# Patient Record
Sex: Female | Born: 1955 | Race: White | Hispanic: No | Smoking: Never smoker
Health system: Southern US, Community
[De-identification: ages and names within clinical notes are randomized; demographics above are authoritative.]

## PROBLEM LIST (undated history)

## (undated) DIAGNOSIS — K589 Irritable bowel syndrome without diarrhea: Secondary | ICD-10-CM

## (undated) DIAGNOSIS — G43909 Migraine, unspecified, not intractable, without status migrainosus: Secondary | ICD-10-CM

## (undated) DIAGNOSIS — G5600 Carpal tunnel syndrome, unspecified upper limb: Secondary | ICD-10-CM

## (undated) DIAGNOSIS — H409 Unspecified glaucoma: Secondary | ICD-10-CM

## (undated) DIAGNOSIS — F419 Anxiety disorder, unspecified: Secondary | ICD-10-CM

## (undated) DIAGNOSIS — H8109 Meniere's disease, unspecified ear: Secondary | ICD-10-CM

## (undated) HISTORY — DX: Migraine, unspecified, not intractable, without status migrainosus: G43.909

## (undated) HISTORY — PX: PELVIC LAPAROSCOPY: SHX162

## (undated) HISTORY — DX: Anxiety disorder, unspecified: F41.9

## (undated) HISTORY — PX: CARPAL TUNNEL RELEASE: SHX101

## (undated) HISTORY — PX: OTHER SURGICAL HISTORY: SHX169

## (undated) HISTORY — DX: Unspecified glaucoma: H40.9

## (undated) HISTORY — DX: Meniere's disease, unspecified ear: H81.09

## (undated) HISTORY — DX: Irritable bowel syndrome, unspecified: K58.9

## (undated) HISTORY — DX: Carpal tunnel syndrome, unspecified upper limb: G56.00

---

## 1998-06-28 ENCOUNTER — Ambulatory Visit (HOSPITAL_COMMUNITY): Admission: RE | Admit: 1998-06-28 | Discharge: 1998-06-28 | Payer: Self-pay | Admitting: Gynecology

## 1999-12-21 ENCOUNTER — Encounter: Payer: Self-pay | Admitting: Gastroenterology

## 1999-12-21 ENCOUNTER — Ambulatory Visit (HOSPITAL_COMMUNITY): Admission: RE | Admit: 1999-12-21 | Discharge: 1999-12-21 | Payer: Self-pay | Admitting: Gastroenterology

## 2000-01-04 ENCOUNTER — Ambulatory Visit (HOSPITAL_COMMUNITY): Admission: RE | Admit: 2000-01-04 | Discharge: 2000-01-04 | Payer: Self-pay | Admitting: Gastroenterology

## 2000-12-31 ENCOUNTER — Encounter: Admission: RE | Admit: 2000-12-31 | Discharge: 2000-12-31 | Payer: Self-pay | Admitting: Family Medicine

## 2000-12-31 ENCOUNTER — Encounter: Payer: Self-pay | Admitting: Family Medicine

## 2001-01-31 ENCOUNTER — Encounter (INDEPENDENT_AMBULATORY_CARE_PROVIDER_SITE_OTHER): Payer: Self-pay

## 2001-01-31 ENCOUNTER — Other Ambulatory Visit: Admission: RE | Admit: 2001-01-31 | Discharge: 2001-01-31 | Payer: Self-pay | Admitting: Gynecology

## 2001-05-15 ENCOUNTER — Other Ambulatory Visit: Admission: RE | Admit: 2001-05-15 | Discharge: 2001-05-15 | Payer: Self-pay | Admitting: Internal Medicine

## 2002-07-09 HISTORY — PX: ABDOMINAL HYSTERECTOMY: SHX81

## 2002-08-03 ENCOUNTER — Encounter (INDEPENDENT_AMBULATORY_CARE_PROVIDER_SITE_OTHER): Payer: Self-pay | Admitting: Specialist

## 2002-08-03 ENCOUNTER — Observation Stay (HOSPITAL_COMMUNITY): Admission: RE | Admit: 2002-08-03 | Discharge: 2002-08-04 | Payer: Self-pay | Admitting: Gynecology

## 2003-09-15 ENCOUNTER — Other Ambulatory Visit: Admission: RE | Admit: 2003-09-15 | Discharge: 2003-09-15 | Payer: Self-pay | Admitting: Gynecology

## 2004-01-13 ENCOUNTER — Encounter: Admission: RE | Admit: 2004-01-13 | Discharge: 2004-01-13 | Payer: Self-pay | Admitting: Gynecology

## 2004-11-17 ENCOUNTER — Other Ambulatory Visit: Admission: RE | Admit: 2004-11-17 | Discharge: 2004-11-17 | Payer: Self-pay | Admitting: Gynecology

## 2005-02-06 ENCOUNTER — Encounter: Admission: RE | Admit: 2005-02-06 | Discharge: 2005-02-06 | Payer: Self-pay | Admitting: Gynecology

## 2005-05-31 ENCOUNTER — Ambulatory Visit (HOSPITAL_BASED_OUTPATIENT_CLINIC_OR_DEPARTMENT_OTHER): Admission: RE | Admit: 2005-05-31 | Discharge: 2005-05-31 | Payer: Self-pay | Admitting: Orthopedic Surgery

## 2006-02-07 ENCOUNTER — Encounter: Admission: RE | Admit: 2006-02-07 | Discharge: 2006-02-07 | Payer: Self-pay | Admitting: Gynecology

## 2006-02-13 ENCOUNTER — Other Ambulatory Visit: Admission: RE | Admit: 2006-02-13 | Discharge: 2006-02-13 | Payer: Self-pay | Admitting: Obstetrics and Gynecology

## 2007-05-21 ENCOUNTER — Other Ambulatory Visit: Admission: RE | Admit: 2007-05-21 | Discharge: 2007-05-21 | Payer: Self-pay | Admitting: Gynecology

## 2008-06-03 ENCOUNTER — Other Ambulatory Visit: Admission: RE | Admit: 2008-06-03 | Discharge: 2008-06-03 | Payer: Self-pay | Admitting: Gynecology

## 2008-06-03 ENCOUNTER — Encounter: Payer: Self-pay | Admitting: Gynecology

## 2008-06-03 ENCOUNTER — Ambulatory Visit: Payer: Self-pay | Admitting: Gynecology

## 2008-07-12 ENCOUNTER — Ambulatory Visit: Payer: Self-pay | Admitting: Gynecology

## 2008-09-30 ENCOUNTER — Ambulatory Visit (HOSPITAL_BASED_OUTPATIENT_CLINIC_OR_DEPARTMENT_OTHER): Admission: RE | Admit: 2008-09-30 | Discharge: 2008-09-30 | Payer: Self-pay | Admitting: Orthopedic Surgery

## 2009-06-06 ENCOUNTER — Ambulatory Visit: Payer: Self-pay | Admitting: Gynecology

## 2009-06-06 ENCOUNTER — Other Ambulatory Visit: Admission: RE | Admit: 2009-06-06 | Discharge: 2009-06-06 | Payer: Self-pay | Admitting: Gynecology

## 2009-07-25 ENCOUNTER — Encounter: Admission: RE | Admit: 2009-07-25 | Discharge: 2009-07-25 | Payer: Self-pay | Admitting: Gastroenterology

## 2010-06-07 ENCOUNTER — Other Ambulatory Visit: Payer: Self-pay | Admitting: Gynecology

## 2010-06-07 ENCOUNTER — Encounter (INDEPENDENT_AMBULATORY_CARE_PROVIDER_SITE_OTHER): Payer: BC Managed Care – PPO | Admitting: Gynecology

## 2010-06-07 ENCOUNTER — Encounter: Payer: Self-pay | Admitting: Obstetrics and Gynecology

## 2010-06-07 ENCOUNTER — Other Ambulatory Visit (HOSPITAL_COMMUNITY)
Admission: RE | Admit: 2010-06-07 | Discharge: 2010-06-07 | Disposition: A | Discharge status: Home / Self Care | Payer: BC Managed Care – PPO | Source: Ambulatory Visit | Attending: Gynecology | Admitting: Gynecology

## 2010-06-07 DIAGNOSIS — Z1211 Encounter for screening for malignant neoplasm of colon: Secondary | ICD-10-CM

## 2010-06-07 DIAGNOSIS — Z124 Encounter for screening for malignant neoplasm of cervix: Secondary | ICD-10-CM | POA: Insufficient documentation

## 2010-06-07 DIAGNOSIS — Z01419 Encounter for gynecological examination (general) (routine) without abnormal findings: Secondary | ICD-10-CM

## 2010-07-17 LAB — BASIC METABOLIC PANEL
BUN: 19 mg/dL (ref 6–23)
CO2: 28 mEq/L (ref 19–32)
Calcium: 9.1 mg/dL (ref 8.4–10.5)
Chloride: 106 mEq/L (ref 96–112)
Creatinine, Ser: 0.75 mg/dL (ref 0.4–1.2)
GFR calc Af Amer: >60 mL/min (ref >60)
GFR calc non Af Amer: >60 mL/min (ref >60)
Glucose, Bld: 77 mg/dL (ref 70–99)
Potassium: 3.5 mEq/L (ref 3.5–5.1)
Sodium: 142 mEq/L (ref 135–145)

## 2010-07-17 LAB — POCT HEMOGLOBIN-HEMACUE: Hemoglobin: 14.2 g/dL (ref 12.0–15.0)

## 2010-08-22 NOTE — Op Note (Signed)
NAME:  Ellen Yu, Ellen Yu              ACCOUNT NO.:  [id number]   MEDICAL RECORD NO.:  [id number]          PATIENT TYPE:  AMB   LOCATION:  DSC                          FACILITY:  [facility]   PHYSICIAN:  Katy Fitch. Sypher, M.D. 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   OPERATION:  1. Release of right transverse carpal ligament.  2. Decompression of the left ulnar nerve at cubital tunnel with      identification of anconeus epitrochlearis muscle that was resected      as well as a small portion of the medial triceps creating an in      situ decompression of the ulnar nerve.   OPERATING SURGEON:  Katy Fitch. Sypher, MD   ASSISTANT:  Nurse.   ANESTHESIA:  General by LMA.   SUPERVISING ANESTHESIOLOGIST:  Quita Skye. 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  She has a history of bilateral hand  numbness.   She had electrodiagnostic studies documenting bilateral carpal tunnel  syndrome and left ulnar neuropathy.  She is status post decompression of  her left carpal tunnel and left ulnar nerve in February 2007 with  satisfactory result.  She has had ongoing right side symptoms and return  for evaluation and management.   We confirmed that she had persistent right carpal tunnel syndrome with  electrodiagnostic studies completed by Dr. Sharmon Revere on August 02, 2008.  She did not have frankly abnormal ulnar nerve conductions,  but did have some slowing across the elbow.  She did, however, have  clinical symptoms of ulnar neuropathy and we had discussed possible  exploration and decompression at the elbow.  She thought this through  and in the holding area, asked me to proceed with ulnar decompression  due to her satisfaction  with prior left-sided surgery.  She did have  possible symptoms.   After informed consent, she was brought to the operating room at this  time.   PROCEDURE:  Ellen Yu was brought to the operating room and placed  in supine position on the operating table.   Following the induction of general anesthesia by LMA, the right arm was  prepped with Betadine soap solution and sterilely draped.  A pneumatic  tourniquet was applied to the proximal right brachium.   Following exsanguination of the right arm with an Esmarch bandage, the  arterial tourniquet was inflated to 230 mmHg.  Procedure commenced with  a short incision in the line of the ring finger in the palm.  Subcutaneous tissues were carefully divided revealing the palmar fascia.  This was split longitudinally to reveal a common sensory branch of the  median nerve.  These were followed back to transcarpal ligaments, which  was gently isolated from median nerve.  The ligament was then released  along its ulnar border extending into the distal forearm.  This widely  opened carpal canal.  Bleeding points along the margin of the released  ligament were electrocauterized with bipolar current followed by repair  of the skin with intradermal 3-0 Prolene suture and a Steri-Strip.  Attention was then directed to the medial right elbow.  A 2-cm incision  was fashioned directly over the path of the ulnar nerve behind the  epicondyle.  Subcutaneous tissues were carefully divided taking care to  identify and gently spare the anterior branch of the medial antebrachial  cutaneous nerve.  The ulnar nerve was identified by palpation posterior  to the epicondyle.  There was a large anconeus epitrochlearis muscle  that was covering the nerve.  This blended with the triceps fascia.  With great care, the anconeus epitrochlearis muscle was resected  exposing the ulnar nerve.  The fascia at the head of the flexor carpi  ulnaris was split.  There were  also several significant fascial bands  compressing deep to the flexor carpi ulnaris.  The fascia was released 6  cm distal to the epicondyle and a Glorious Peach as well as fine tenotomy  scissors were used to neurolysis of nerve distally 6 cm beyond the  epicondyle.  Care was taken to protect the motor branches of the  extensor carpi ulnaris.   Proximal dissection with the aid of a Sewell elevator released fascia to  level of the arcade of Struthers.  Care was taken to inspect the triceps  aponeurosis make sure there was no sign of compression with elbow  flexion, extension.   Bleeding points were electrocauterized with bipolar current.  Ellen Yu  was noted to have a rather shallow cubital groove.  It is possible that  she has a low-grade instability of the nerve.  There was no fusiform  swelling.  Therefore, we did not transpose the nerve.  However, with a  history of anconeus epitrochlearis, it is possible that she could  develop a late subluxation of the nerve with prolonged elbow flexion.   We will be vigilant for this.  Care was taken to leave a large flap of  arcuate ligament anteriorly to try to discourage any anterior  subluxation of the nerve.   The wound was inspected for bleeding points, which were  electrocauterized with bipolar current followed by repair of the skin  with subcutaneous suture of 4-0 Vicryl and intradermal 3-0 Prolene  followed by Steri-Strips.   Both wounds were infiltrated with 2% lidocaine for postop analgesia.  The wrist was immobilized with a volar plaster splint and the elbow  dressed with sterile gauze, Tegaderm, and Ace wrap.   Ellen Yu will be encouraged to begin immediate range of motion excise  of the elbow.  She will elevate her hand.  I will see her back for  followup in 7 days for dressing change and initiation of a therapy  program.  We anticipate suture removal of the elbow in 2 weeks and at  the wrist in about 10-14 days.      Katy Fitch Sypher, M.D.  Electronically Signed     RVS/MEDQ  D:  09/30/2008  T:  10/01/2008  Job:  [id number]

## 2010-08-25 NOTE — Op Note (Signed)
NAME:  Ellen Yu, Ellen Yu              ACCOUNT NO.:  [id number]   MEDICAL RECORD NO.:  [id number]          PATIENT TYPE:  AMB   LOCATION:  DSC                          FACILITY:  [facility]   PHYSICIAN:  Katy Fitch. Sypher, M.D. DATE OF BIRTH:  12/09/1955   DATE OF PROCEDURE:  05/31/2005  DATE OF DISCHARGE:                                 OPERATIVE REPORT   PREOPERATIVE DIAGNOSIS:  1.  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  2.  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   POSTOPERATIVE DIAGNOSIS:  1.  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  2.  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   OPERATION:  1.  Decompression of left ulnar nerve at cubital tunnel in situ with partial      triceps aponeurosis resection.  2.  Left transverse carpal ligament release to decompress median nerve   SURGEON:  Katy Fitch. Sypher, M.D.   ASSISTANT:  Ellen Yu, P.A.-C.   ANESTHESIA:  General by LMA.   SUPERVISING ANESTHESIOLOGIST:  Ellen Yu, M.D.   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  Dr. 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  [name] left hand was more severely involved than the right.  We  recommended proceeding with release of her left transcarpal ligament and  decompression of left ulnar nerve across the elbow at this time.   Preoperatively, Ellen Yu was seen for detailed informed consent in the  office and we spent a considerable period time with her in the preoperative  holding area while we answered a page full of questions that she had brought  in written form.  She was advised that decompression of the nerve should  lead to improved blood  supply and improved sensation and strength.  Given  the fact that she has for nerve compression syndromes, we must continue our  search for a constitutional factor causing her predicament.  To date, Dr.  Neale Yu has identified, as a practicing neurologist, through clinical  examination and EMG nerve conduction, that she has apparently four  entrapment neuropathy predicaments.   After informed consent, she is brought to the operating room at this time  anticipating simple decompression of her median and ulnar nerves of the left  upper extremity.   PROCEDURE:  Ellen Yu is brought to the operating room and placed in  supine position on the operating table.  Following the induction of general  anesthesia by LMA technique, the left arm was prepped with Betadine soap  solution and sterilely draped.  A pneumatic tourniquet was applied to the  proximal left brachium.  Following exsanguination of the left arm with an  Esmarch bandage, the arterial tourniquet on the proximal brachium was  inflated to 220 mmHg.   The procedure commenced with a short incision in the line of the ring finger  and the palm.  The subcutaneous tissues were carefully divided from the  palmar fascia. This was split longitudinally to the common sensory branch of  the median nerve.  The common sensory branches were followed back to the  level of the median nerve proper.  The median nerve was gently isolated from  the transcarpal ligament by use of a Insurance risk surveyor.  After the  transcarpal ligament had been isolated, it was split along its ulnar border  with scissors extending into the distal forearm.  This widely opened carpal  canal.  No masses or other predicaments were noted.  Bleeding points along  the margin of the released ligament were electrocauterized with bipolar  current followed by repair the skin with intradermal 3-0 Prolene suture.   Attention was then directed to the elbow.  A 2 cm incision was  fashioned  directly over the path of the ulnar nerve posterior to the medial  epicondyle.  The subcutaneous tissue was carefully divided revealing a  muscle band that appeared to be a variant of the anconeus epitrochlearis  completely covering the nerve extending from the epicondyle to the flexor  carpi ulnaris.  The ulnar nerve was identified by palpation proximal to the  epicondyle and the muscle was gently released off of the epicondyle,  followed by release of the fascia of the arcuate ligament as well as  Osborne's band and the fascia of the head of flexor carpi ulnaris was split  over a distance of 4 cm followed by use of Freer to split the muscle fibers  and scissors and Freer to release fibrous bands within the muscle.  The  brachial fascia was released 4 cm above the elbow.  It appeared that the  compression was directly deep to the atypical muscle.  The elbow was ranged  0 to 140 degrees.  There was a sizable portion of the triceps muscle  posterior and deep to the nerve which caused it to roll anteriorly.  Therefore, a band of the triceps aponeurosis and a small portion of the  triceps medial head was resected with scissors and electrocautery  dissection.  The nerve was fully unroofed and had limited roll after this  aponeurosis and small amount of muscle was resected.  In my judgment, [name] should have this satisfactory decompression of her ulnar neuropathy  symptoms after this maneuver.  Bleeding points were cauterized with bipolar  current followed by repair the skin with intradermal 3-0 Prolene and Steri-  Strips.  There were no apparent complications.  Ellen Yu tolerated surgery  and anesthesia well.  She is transferred to the recovery room with stable  vital signs.      Katy Fitch Sypher, M.D.  Electronically Signed     RVS/MEDQ  D:  05/31/2005  T:  05/31/2005  Job:  [id number]

## 2010-08-25 NOTE — Op Note (Signed)
NAME:  Ellen Yu, Ellen Yu                        ACCOUNT NO.:  [id number]   MEDICAL RECORD NO.:  [id number]                   PATIENT TYPE:  INP   LOCATION:  9145                                 FACILITY:  [facility]   PHYSICIAN:  Juan H. Lily Peer, M.D.             DATE OF BIRTH:  [birth year]   DATE OF PROCEDURE:  08/03/2002  DATE OF DISCHARGE:                                 OPERATIVE REPORT   SURGEON:  Juan H. Lily Peer, M.D.   FIRST ASSISTANT:  Daniel L. Eda Paschal, M.D.   INDICATIONS:  The patient with symptomatic menorrhagia, dysmenorrhea,  anemia, and mild stress urinary incontinence and with a cystocele.   PREOPERATIVE DIAGNOSES:  1. Symptomatic second degree cystocele.  2. Dysmenorrhea.  3. Menorrhagia.  4. Anemia.  5. Mild uterine descensus.   POSTOPERATIVE DIAGNOSES:  1. Symptomatic second degree cystocele.  2. Dysmenorrhea.  3. Menorrhagia.  4. Anemia.  5. Mild uterine descensus.   ANESTHESIA:  General endotracheal anesthesia.   FINDINGS:  1. The patient with a second degree cystourethrocele.  2. Mild uterine descensus.  3. Very mild, if any, rectocele.  4. Normal sized uterus.  5. Normal tubes and ovaries.   DESCRIPTION OF OPERATION:  After the patient was adequately counseled she  was taken to the operating room where she was placed in the high lithotomy  position after general endotracheal anesthesia was obtained.  The patient  previously had signed the consent form and we decided to proceed with  bilateral salpingo-oophorectomy at time of hysterectomy.  After legs were up  in the stirrups, vagina and perineum were prepped and draped in the usual  sterile fashion.  A Foley catheter was placed in effort to monitor urinary  output.  A short weighted bill speculum was placed in posterior vaginal  vault and the Deaver retractors were used to exposure.  Two Lahey __________  clamps were utilized to grasp the anterior and posterior cervix.  1%  lidocaine with  1:100,000 epinephrine was infiltrated into the cervical  vaginal fold.  The cervical vaginal fold region was incised with a scalpel  in the circumferential fashion and a posterior colpotomy was made and the  large weighted bill speculum was exchanged for the short weighted bill  speculum.  Both uterosacral ligaments on either side were clamped, cut, and  suture ligated with 0 Vicryl suture.  The remainders of the broad and  cardinal ligaments were clamped, cut, and suture ligated with 0 Vicryl  suture.  Attention was placed to the inferior portion of the cervical  vaginal fold.  The bladder was dissected away from the cervix and lower  uterine segment.  The peritoneal cavity was entered and a Occupational hygienist  was inserted.  The cervical pedicles on both sides were clamped, cut, and  the uterus and cervix was passed off the operative field and both pedicles  were identified carefully.  There was no bleeding.  The  tube and ovary were  grasped with the Babcock clamp and each infundibulopelvic ligament  separately was free tied with 0 Vicryl suture followed by a transfixation  stitch and each tube and ovary were passed off the operative field and sent  off for histological evaluation.  After inspecting the pedicles and  demonstrating adequate hemostasis at this point the McCall's culdoplasty was  started in the following fashion.  A 2-0 Vicryl suture was introduced into  the 6 o'clock position of the vaginal cuff and reefing the peritoneum to  incorporate the uterosacral ligament on patient's left coming across the  edge where the peritoneum was to the contralateral uterosacral ligament and  returning back and exiting the 6 o'clock position.  This was placed in a  clamp and not secured until later on in the operation.  At this point we  proceeded then with the anterior colporrhaphy in the following fashion.  Allis clamps were placed in the area of the cuff and with scissors  dissection the  vaginal mucosa was accomplished to the region of the external  urethral meatus.  Traction was maintained on the vagina along the course of  the dissection to keep the wall off the bladder separate from the vaginal  mucosa in an effort to avoid trauma to the bladder.  Sharp dissection of the  adherent fascia from the vaginal mucosa was accomplished.  The dissection  was deep enough to reach the white relatively avascular plane just beneath  the vaginal epithelium.  Blunt finger dissection with a single thickness  gauze sponge was used to provide significant traction against the fascia to  free it from the mucosa beneath the urethra and bladder.  Beginning at the  external meatus successive vertical mattress sutures with 2-0 Vicryl were  placed in the mobilized periurethral fashion.  The  Kelly clamp was utilized  to depress the floor of the urethra as the sutures were tied to avoid any  necrosis of the wall of the urethra.  The last suture at the bladder neck  was anchored to the posterior pubourethral ligament as well.  The suture  passed through the periurethral fascia and then firmly inserted through the  pubourethral ligament on the posterior aspect of the symphysis pubis and a  second suture was placed on the opposite side of the urethra were  incorporated in same anatomical structure.  Both sutures were tied drawing  the periurethral fascia beneath the urethra and elevating the posterior  urethra to a high retropubic position.  The normal urethrovesical anatomy  was restored and the excess vaginal mucosa was excised.  The mucosal margins  were approximated in the midline with a running stitch of 2-0 Vicryl suture  that included some of the underlying fascia.  At this point the remainder of  vaginal cuff was closed by applying interrupted sutures of 0 Vicryl suture  and then at the end securing the McCall's culdoplasty and tying it and bringing it close and securing the knot.  Attention  was then placed to the  posterior vagina whereby initially there was a question whether rectocele  was needed or not.  The patient had good support it appears and no major  rectocele so it was decided not to proceed with a rectocele repair which was  so minimal the defect that it could potentially cause narrowing in her  vagina.  This will be explained to her postoperatively.  The vaginal cuff  was irrigated with normal saline solution and the  vagina was packed with  Premarin cream impregnated gauze into the vagina.  The patient tolerated  procedure well.  Blood loss was less than 75 mL.  Urine output was 225 mL.  She received 1600 mL of lactated Ringer's.  She had pneumatic compression  stockings to avoid deep venous thrombosis and received 1 g of Cefotan  prophylactically.  She was awakened and transferred to recovery room with  stable vital signs.                                               Juan H. Lily Peer, M.D.    JHF/MEDQ  D:  08/03/2002  T:  08/04/2002  Job:  [id number]

## 2010-08-25 NOTE — H&P (Signed)
NAME:  Ellen Yu, Ellen Yu                        ACCOUNT NO.:  [id number]   MEDICAL RECORD NO.:  [id number]                   PATIENT TYPE:  INP   LOCATION:  NA                                   FACILITY:  [facility]   PHYSICIAN:  Juan H. Lily Peer, M.D.             DATE OF BIRTH:  [birth year]   DATE OF ADMISSION:  08/03/2002  DATE OF DISCHARGE:                                HISTORY & PHYSICAL   CHIEF COMPLAINT:  1. Menometrorrhagia.  2. Anemia.   HISTORY:  The patient is a 55 year old gravida 4 para 2 who has been seen in  the office in 2003 on several occasions due to her menometrorrhagia.  She  had also seen another practitioner in the community several years ago  whereby he had done hysteroscopy and D&C for suspected endometrial polyp and  none was seen.  The patient has been on oral contraceptives pill in effort  to regulate her cycle and still complains of menorrhagia and cramping and  she also has an anatomical defect consisting of a second degree cystocele  and first degree rectocele and she suffers from mild stress urinary  incontinence.  She had been given options before to proceed with an  endometrial ablation and concurrent tubal sterilization.  However, due to  religious conviction she did not want to go that route.  She did have an  endometrial biopsy in December 2003 which demonstrated a degenerating  secretory endometrium with no hyperplasia.  She has had an [facility] and prolactin  which were normal in December 2003 and her last hemoglobin was 13.8.  She  has decided to proceed with a total vaginal hysterectomy, anterior and  posterior repair, and Kelly plication.   ALLERGIES:  She denies any medical allergies.   PAST MEDICAL HISTORY:  Irritable bowel syndrome for which she is being  followed by Dr. Loreta Ave.   PREVIOUS SURGERY:  Surgery on her left ear and mastoid surgery four times.  She has had hysteroscopy and D&C.  She has had two D&Cs with spontaneous ABs  prior to that.   She has had two normal spontaneous vaginal deliveries as  well.   MEDICATIONS:  She is currently on Nu-Iron one tablet daily.  She takes  multivitamin.  She takes Pepcid, Aciphex, and Lactaid, and Nasonex.  She had  been on the Alesse in effort to regulate her cycles.   FAMILY HISTORY:  Significant for her grandfather and cousin with diabetes.  Parents and older sibling with hypertension.  Parents with cardiovascular  disease.   PHYSICAL EXAMINATION:  VITAL SIGNS:  The patient weighs 170 pounds, 5 feet 9  inches tall.  Blood pressure 130/80.  HEENT:  Unremarkable.  NECK:  Supple, trachea midline.  No carotid bruits or thyromegaly.  LUNGS:  Clear to auscultation without rhonchi or wheezes.  HEART:  Regular rate and rhythm, no murmur or gallop.  BREAST:  Exam done at her  annual exam in February 2003 which was normal.  ABDOMEN:  Soft, nontender, without rebound or guarding.  PELVIC:  Bartholin, urethra, Skene glands within normal limits.  Vagina with  a secondary cystocele and first to second degree rectocele.  Uterus  anteverted and at the upper limits of normal.  Adnexa nonpalpable without  mass or tenderness.  RECTAL:  Unremarkable.  Hemoccult negative.   ASSESSMENT:  A 55 year old gravida 4 para 2 abortus 2 with symptomatic  menometrorrhagia unresponsive to medical therapy, and symptomatic stress  urinary incontinence due to anatomical defect with secondary cystocele and  also has a first degree rectocele and some mild descensus.  The patient is  scheduled to undergo a transvaginal hysterectomy, anterior and posterior  colporrhaphy with Kelly plication on Monday, April 26.   The risks, benefits, pros and cons of procedure discussed with the patient  as follows:  1. The risk for infection, for which she will receive prophylaxis     antibiotics.  2. The risk for hemorrhage.  In the event that she would need a blood     transfusion there is a risk of anaphylactic reaction,  hepatitis, and     AIDS.  3. The risk for deep venous thrombosis and particularly pulmonary embolism.  4. Trauma to nearby organs such as the bladder, intestines, or in the event     that the procedure is not able to be complete transvaginally an open     abdominal approach may need to be done in an effort to complete the     operation.   The patient will receive prophylactic antibiotics, and pneumatic compression  stockings in an effort to prevent deep vein thrombosis.  Also, the patient  is fully aware that sometimes after vaginal colporrhaphies that there could  be some form of vaginal stricture or discomfort during intercourse that may  require time to heal and the tissues to stretch before she has relief from  discomfort during intercourse.  All of these issues were discussed with the  patient in detail.  She is fully aware that if she engages in any strenuous  activity after the procedure without significant time to recover that this  may break down; she may need additional repair such as a sling procedure if  she were to continue to have stress incontinence at that point, and that she  would need to be referred to a urologist at that point.  All of these issues  were discussed with the patient.  All questions were answered and will  follow accordingly.   PLAN:  The patient is scheduled for Monday, August 03, 2002 at 7:30 a.m. at  [facility] for transvaginal hysterectomy,  anterior/posterior colporrhaphy, and Kelly plication.                                               Juan H. Lily Peer, M.D.    JHF/MEDQ  D:  07/31/2002  T:  07/31/2002  Job:  [id number]

## 2010-08-25 NOTE — Procedures (Signed)
[place]. [facility]  Patient:    Ellen Yu, Ellen Yu                       MRN: [id number] Proc. Date: 01/04/00 Adm. Date:  [id number] Attending:  Charna Elizabeth CC:         Teena Irani. Arlyce Dice, M.D.   Procedure Report  DATE OF BIRTH:  REFERRING PHYSICIAN:  Teena Irani. Arlyce Dice, M.D.  PROCEDURE PERFORMED:  Colonoscopy.  ENDOSCOPIST:  Anselmo Rod, M.D.  INSTRUMENT USED:  Olympus video colonoscope.  INDICATIONS FOR PROCEDURE:  Blood in stool and abdominal pain and history of diarrhea in a 54 year old white female.  Rule out colonic polyps, masses, hemorrhoids, etc.  PREPROCEDURE PREPARATION:  Informed consent was procured from the patient. The patient was fasted for eight hours prior to the procedure and prepped with a bottle of magnesium citrate and a gallon of NuLytely the night prior to the procedure.  PREPROCEDURE PHYSICAL:  The patient had stable vital signs.  Neck supple. Chest clear to auscultation.  S1, S2 regular.  Abdomen soft with normal abdominal bowel sounds.  DESCRIPTION OF PROCEDURE:  The patient was placed in the left lateral decubitus position and sedated with an additional 50 mg of Demerol and 5 mg of Versed intravenously.  Once the patient was adequately sedated and maintained on low-flow oxygen and continuous cardiac monitoring, the Olympus video colonoscope was advanced from the rectum to the cecum without difficulty. Except for small internal hemorrhoids, no other abnormalities were seen.  No masses, polyps, erosions, ulcerations or diverticula were present.  IMPRESSION:  Normal colonoscopy except for small nonbleeding internal hemorrhoids.  RECOMMENDATIONS:  I suspect the patient has a component of irritable bowel syndrome.  A high fiber diet with liberal fluid intake has been advocated. Outpatient follow-up has been advised on a p.r.n. basis.DD:  01/04/00 TD:  01/04/00 Job: [id number]

## 2010-08-25 NOTE — Op Note (Signed)
[place]  Patient:    Ellen Yu, Ellen Yu                       MRN: [id number] Proc. Date: 01/04/00 Adm. Date:  [id number] Attending:  Charna Elizabeth CC:         Teena Irani. Arlyce Dice, M.D.   Operative Report  DATE OF BIRTH:  July 06, 1955  REFERRING PHYSICIAN:  Teena Irani. Arlyce Dice, M.D.  PROCEDURE PERFORMED:  Esophagogastroduodenoscopy.  ENDOSCOPIST:  Anselmo Rod, M.D.  INSTRUMENT USED:  Olympus video panendoscope.  INDICATIONS FOR PROCEDURE:  Guaiac positive stool and abdominal pain in a 55 year old white female rule out peptic ulcer disease, esophagitis, gastritis, etc.  PREPROCEDURE PREPARATION:  Informed consent was procured from the patient. The patient was fasted for eight hours prior to the procedure.  PREPROCEDURE PHYSICAL:  The patient had stable vital signs.  Neck supple. Chest clear to auscultation.  S1, S2 regular.  Abdomen soft with normal abdominal bowel sounds.  DESCRIPTION OF PROCEDURE:  The patient was placed in left lateral decubitus position and sedated with 50 mg of Demerol and 5 mg of Versed intravenously. Once the patient was adequately sedated and maintained on low-flow oxygen and continuous cardiac monitoring, the Olympus video panendoscope was advanced through the mouthpiece, over the tongue, into the esophagus under direct vision.  The entire esohpagus appeared normal without evidence of ring, stricture, masses, lesions or esophagitis.  The scope was then advanced to the stomach.  A small hiatal hernia was seen on high retroflexion.  The rest of the gastric mucosa and the proximal small bowel up to 70 cm appeared normal.  IMPRESSION:  Normal esophagogastroduodenoscopy except for a small hiatal hernia.  RECOMMENDATION:  Proceed with colonoscopy at this time. DD:  01/04/00 TD:  01/04/00 Job: [id number]

## 2011-04-08 IMAGING — RF DG ESOPHAGUS
12 series · 19 of 19 positions shown · non-contrast
Comparison: None.

CLINICAL DATA: Gastroesophageal reflux disease.

ESOPHOGRAM/BARIUM SWALLOW
TECHNIQUE: Combined double contrast and single contrast
examination performed using effervescent crystals, thick barium
liquid, and thin barium liquid.
Fluoroscopy time:  0.9 minutes.

[Series 1: run · 5 of 5 slices shown (1 of 12)]
[im 1/5]
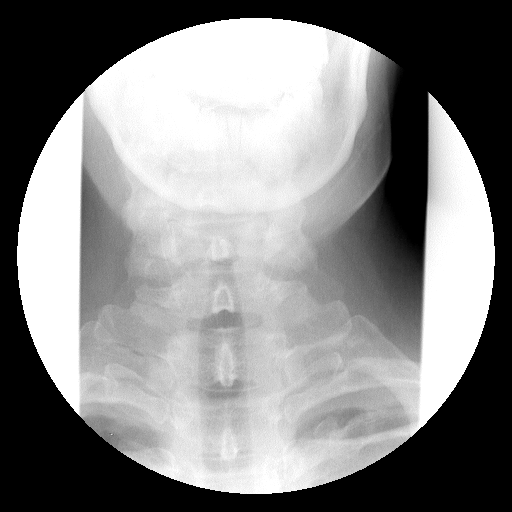
[im 2/5]
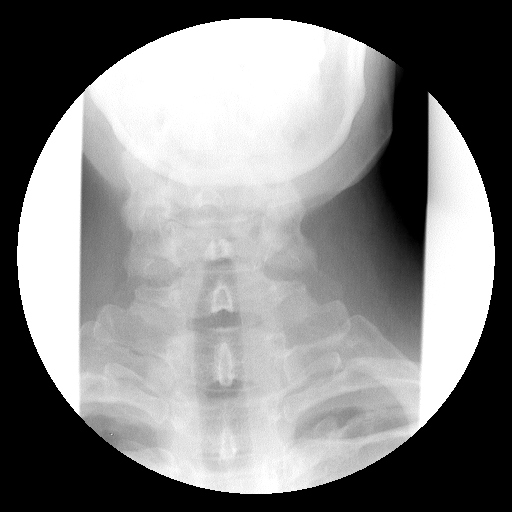
[im 3/5]
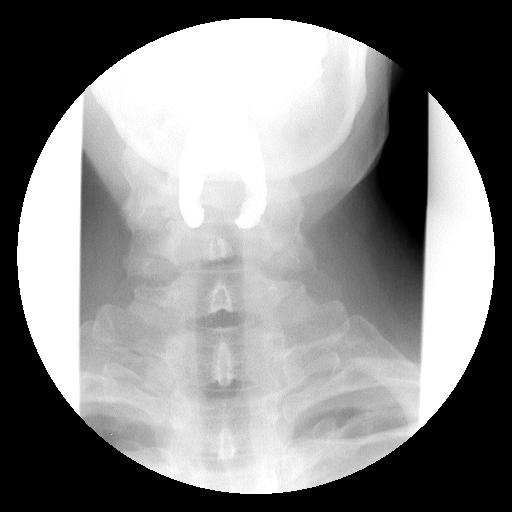
[im 4/5]
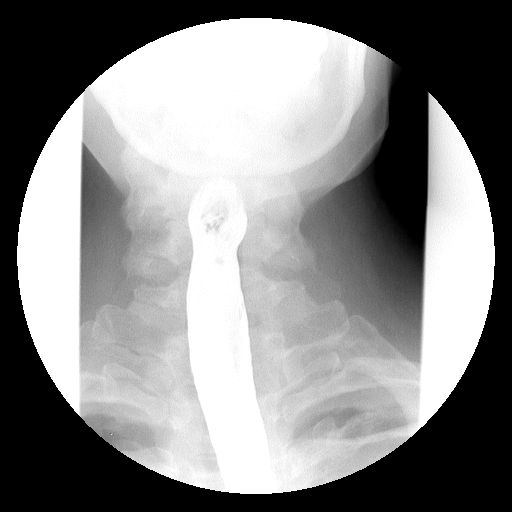
[im 5/5]
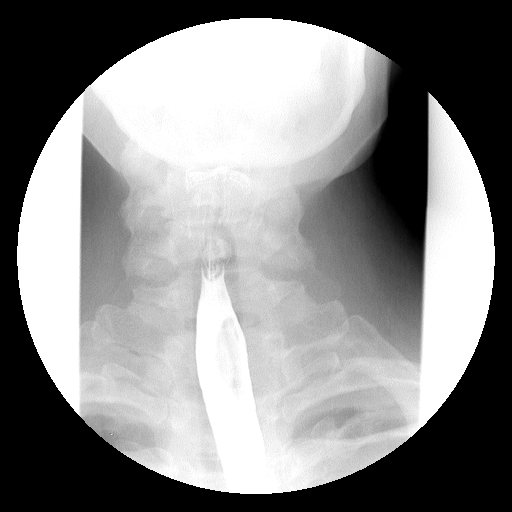

[Series 2: run · 1 of 1 slices shown (2 of 12)]
[im 1/1]
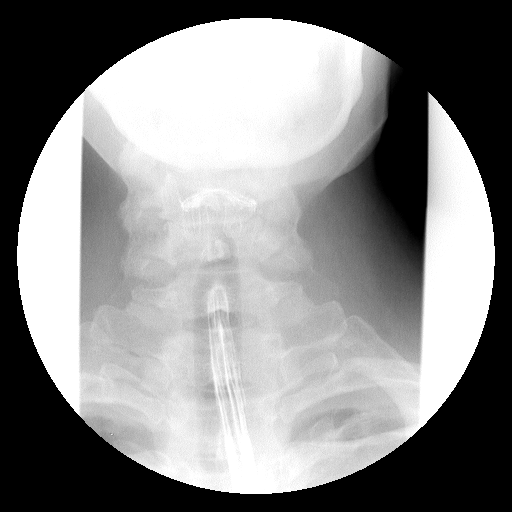

[Series 3: run · 4 of 4 slices shown (3 of 12)]
[im 1/4]
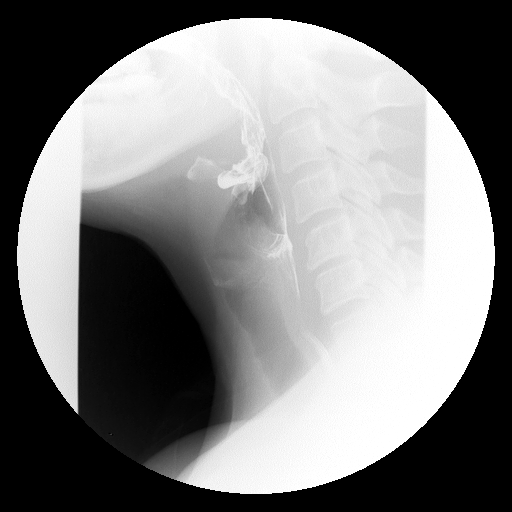
[im 2/4]
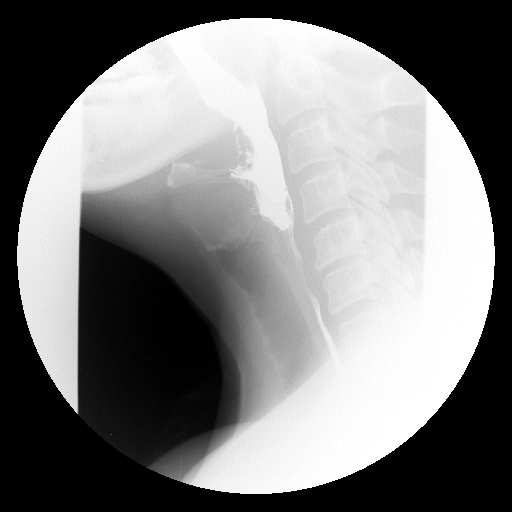
[im 3/4]
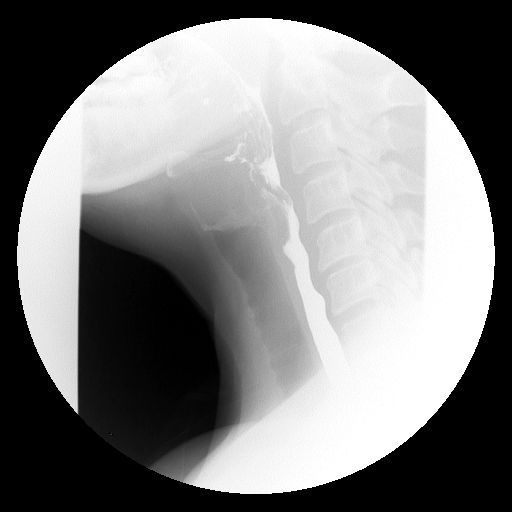
[im 4/4]
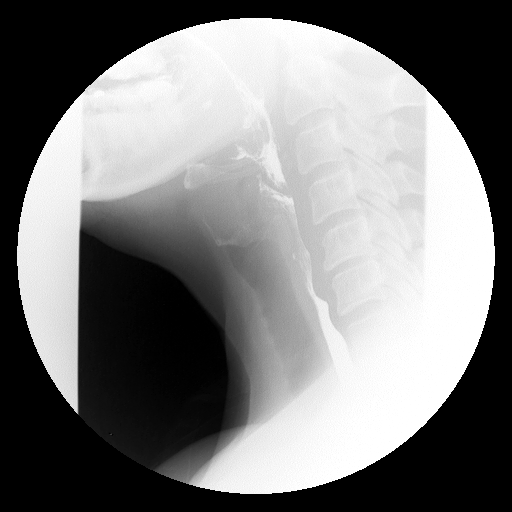

[Series 4: run · 1 of 1 slices shown (4 of 12)]
[im 1/1]
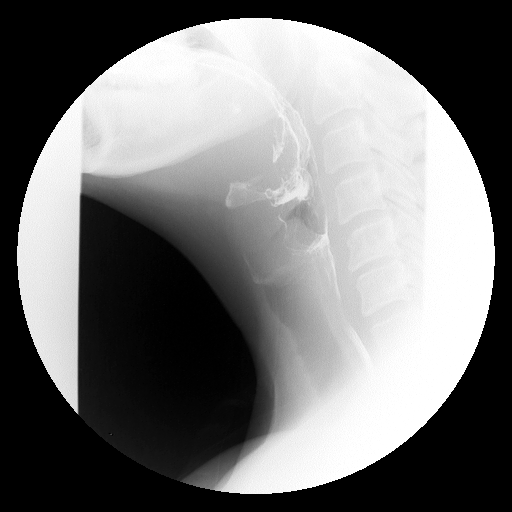

[Series 5: run · 1 of 1 slices shown (5 of 12)]
[im 1/1]
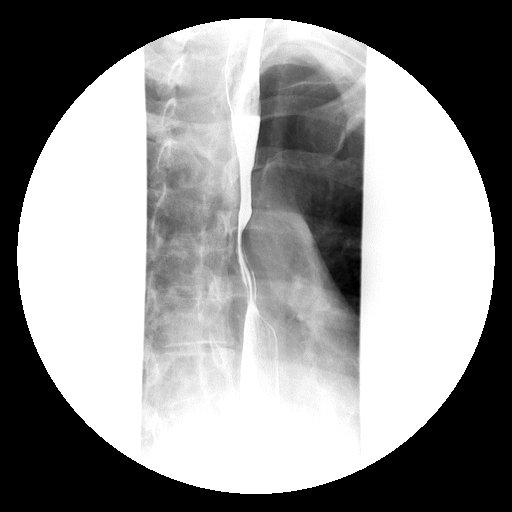

[Series 6: run · 1 of 1 slices shown (6 of 12)]
[im 1/1]
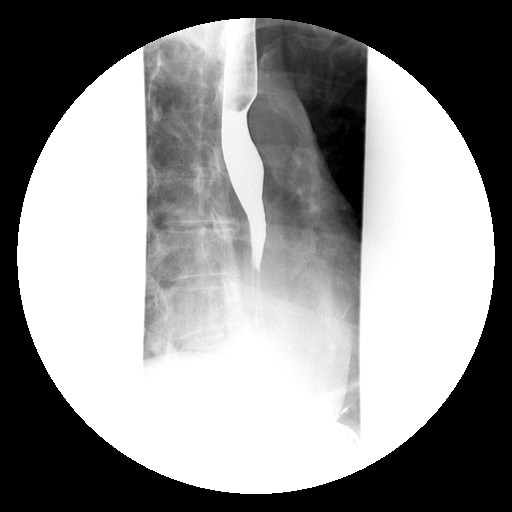

[Series 7: run · 1 of 1 slices shown (7 of 12)]
[im 1/1]
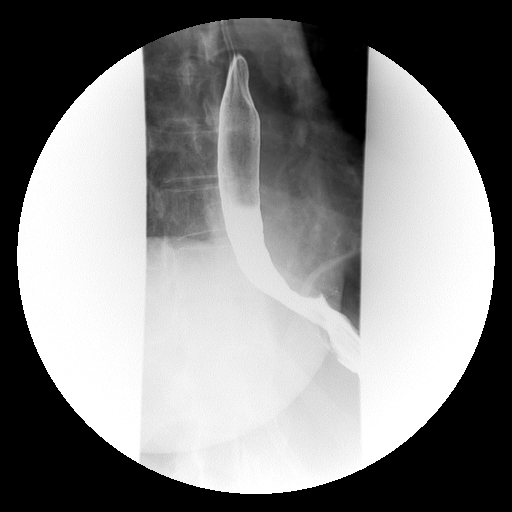

[Series 8: run · 1 of 1 slices shown (8 of 12)]
[im 1/1]
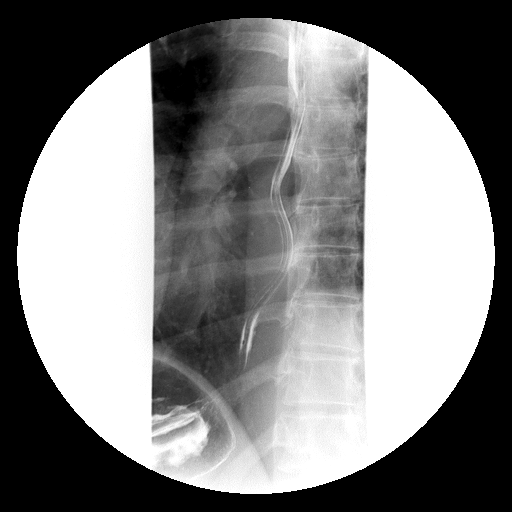

[Series 9: run · 1 of 1 slices shown (9 of 12)]
[im 1/1]
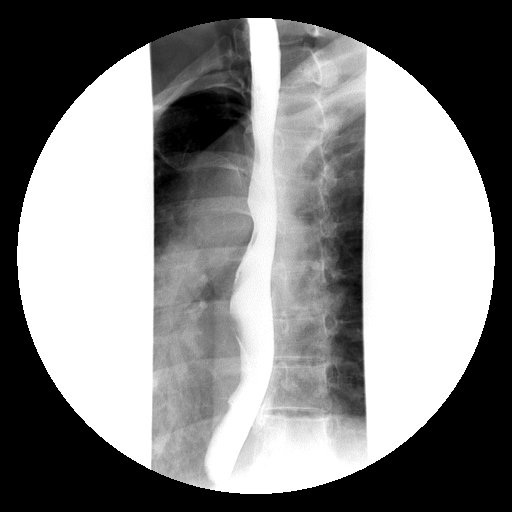

[Series 10: run · 1 of 1 slices shown (10 of 12)]
[im 1/1]
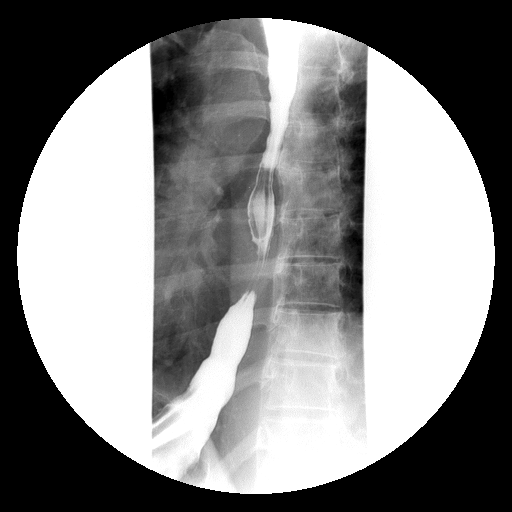

[Series 11: run · 1 of 1 slices shown (11 of 12)]
[im 1/1]
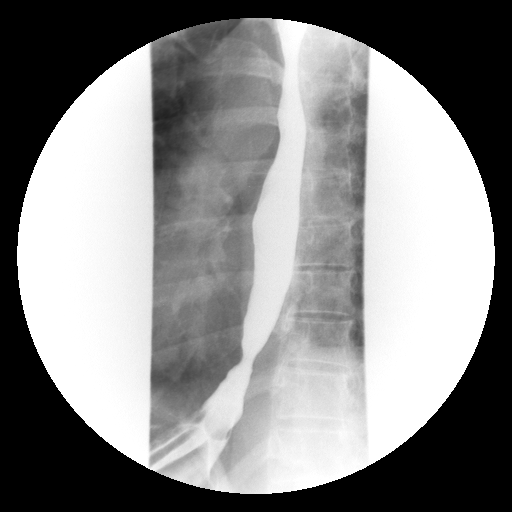

[Series 12: run · 1 of 1 slices shown (12 of 12)]
[im 1/1]
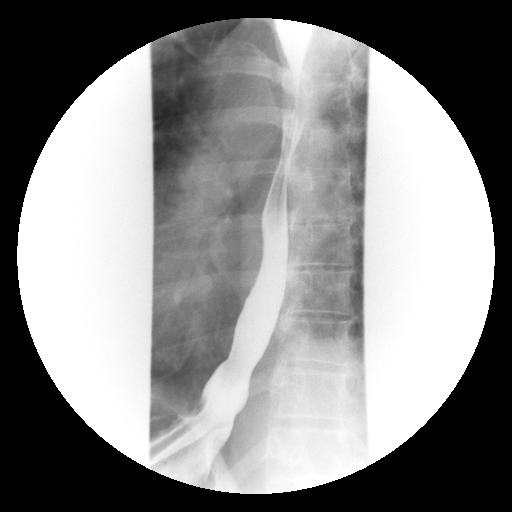

[19 of 19 positions shown; findings below may reference images not displayed]

FINDINGS: Swallowing mechanism is unremarkable.  Primary
peristaltic waves are identified.  No esophageal fold thickening,
stricture or obstruction.  Small hiatal hernia.  A 13 mm barium
pill passed into the stomach without difficulty.
IMPRESSION: Small hiatal hernia.

## 2011-05-08 ENCOUNTER — Other Ambulatory Visit: Payer: Self-pay | Admitting: *Deleted

## 2011-05-08 DIAGNOSIS — N63 Unspecified lump in unspecified breast: Secondary | ICD-10-CM

## 2011-05-14 ENCOUNTER — Other Ambulatory Visit: Payer: Self-pay | Admitting: Gynecology

## 2011-05-14 DIAGNOSIS — N63 Unspecified lump in unspecified breast: Secondary | ICD-10-CM

## 2011-07-03 ENCOUNTER — Other Ambulatory Visit: Payer: Self-pay | Admitting: Women's Health

## 2011-09-19 ENCOUNTER — Ambulatory Visit (INDEPENDENT_AMBULATORY_CARE_PROVIDER_SITE_OTHER): Payer: BC Managed Care – PPO | Admitting: Gynecology

## 2011-09-19 ENCOUNTER — Other Ambulatory Visit: Payer: Self-pay | Admitting: *Deleted

## 2011-09-19 ENCOUNTER — Other Ambulatory Visit (HOSPITAL_COMMUNITY)
Admission: RE | Admit: 2011-09-19 | Discharge: 2011-09-19 | Disposition: A | Discharge status: Home / Self Care | Payer: BC Managed Care – PPO | Source: Ambulatory Visit | Attending: Gynecology | Admitting: Gynecology

## 2011-09-19 ENCOUNTER — Encounter: Payer: Self-pay | Admitting: Gynecology

## 2011-09-19 VITALS — BP 116/82 | Ht 69.0 in | Wt 169.0 lb

## 2011-09-19 DIAGNOSIS — Z01419 Encounter for gynecological examination (general) (routine) without abnormal findings: Secondary | ICD-10-CM | POA: Insufficient documentation

## 2011-09-19 DIAGNOSIS — H8109 Meniere's disease, unspecified ear: Secondary | ICD-10-CM

## 2011-09-19 DIAGNOSIS — Z1211 Encounter for screening for malignant neoplasm of colon: Secondary | ICD-10-CM

## 2011-09-19 DIAGNOSIS — Z1159 Encounter for screening for other viral diseases: Secondary | ICD-10-CM | POA: Insufficient documentation

## 2011-09-19 MED ORDER — ESTRADIOL ACETATE 0.05 MG/24HR VA RING: 1.0000 | VAGINAL_RING | VAGINAL | Status: DC

## 2011-09-19 NOTE — Patient Instructions (Addendum)
Health Maintenance, Females A healthy lifestyle and preventative care can promote health and wellness.  Maintain regular health, dental, and eye exams.   Eat a healthy diet. Foods like vegetables, fruits, whole grains, low-fat dairy products, and lean protein foods contain the nutrients you need without too many calories. Decrease your intake of foods high in solid fats, added sugars, and salt. Get information about a proper diet from your caregiver, if necessary.   Regular physical exercise is one of the most important things you can do for your health. Most adults should get at least 150 minutes of moderate-intensity exercise (any activity that increases your heart rate and causes you to sweat) each week. In addition, most adults need muscle-strengthening exercises on 2 or more days a week.    Maintain a healthy weight. The body mass index (BMI) is a screening tool to identify possible weight problems. It provides an estimate of body fat based on height and weight. Your caregiver can help determine your BMI, and can help you achieve or maintain a healthy weight. For adults 20 years and older:   A BMI below 18.5 is considered underweight.   A BMI of 18.5 to 24.9 is normal.   A BMI of 25 to 29.9 is considered overweight.   A BMI of 30 and above is considered obese.   Maintain normal blood lipids and cholesterol by exercising and minimizing your intake of saturated fat. Eat a balanced diet with plenty of fruits and vegetables. Blood tests for lipids and cholesterol should begin at age 20 and be repeated every 5 years. If your lipid or cholesterol levels are high, you are over 50, or you are a high risk for heart disease, you may need your cholesterol levels checked more frequently.Ongoing high lipid and cholesterol levels should be treated with medicines if diet and exercise are not effective.   If you smoke, find out from your caregiver how to quit. If you do not use tobacco, do not start.    If you are pregnant, do not drink alcohol. If you are breastfeeding, be very cautious about drinking alcohol. If you are not pregnant and choose to drink alcohol, do not exceed 1 drink per day. One drink is considered to be 12 ounces (355 mL) of beer, 5 ounces (148 mL) of wine, or 1.5 ounces (44 mL) of liquor.   Avoid use of street drugs. Do not share needles with anyone. Ask for help if you need support or instructions about stopping the use of drugs.   High blood pressure causes heart disease and increases the risk of stroke. Blood pressure should be checked at least every 1 to 2 years. Ongoing high blood pressure should be treated with medicines, if weight loss and exercise are not effective.   If you are 55 to 55 years old, ask your caregiver if you should take aspirin to prevent strokes.   Diabetes screening involves taking a blood sample to check your fasting blood sugar level. This should be done once every 3 years, after age 45, if you are within normal weight and without risk factors for diabetes. Testing should be considered at a younger age or be carried out more frequently if you are overweight and have at least 1 risk factor for diabetes.   Breast cancer screening is essential preventative care for women. You should practice "breast self-awareness." This means understanding the normal appearance and feel of your breasts and may include breast self-examination. Any changes detected, no matter how   small, should be reported to a caregiver. Women in their 20s and 30s should have a clinical breast exam (CBE) by a caregiver as part of a regular health exam every 1 to 3 years. After age 40, women should have a CBE every year. Starting at age 40, women should consider having a mammogram (breast X-ray) every year. Women who have a family history of breast cancer should talk to their caregiver about genetic screening. Women at a high risk of breast cancer should talk to their caregiver about having  an MRI and a mammogram every year.   The Pap test is a screening test for cervical cancer. Women should have a Pap test starting at age 21. Between ages 21 and 29, Pap tests should be repeated every 2 years. Beginning at age 30, you should have a Pap test every 3 years as long as the past 3 Pap tests have been normal. If you had a hysterectomy for a problem that was not cancer or a condition that could lead to cancer, then you no longer need Pap tests. If you are between ages 65 and 70, and you have had normal Pap tests going back 10 years, you no longer need Pap tests. If you have had past treatment for cervical cancer or a condition that could lead to cancer, you need Pap tests and screening for cancer for at least 20 years after your treatment. If Pap tests have been discontinued, risk factors (such as a new sexual partner) need to be reassessed to determine if screening should be resumed. Some women have medical problems that increase the chance of getting cervical cancer. In these cases, your caregiver may recommend more frequent screening and Pap tests.   The human papillomavirus (HPV) test is an additional test that may be used for cervical cancer screening. The HPV test looks for the virus that can cause the cell changes on the cervix. The cells collected during the Pap test can be tested for HPV. The HPV test could be used to screen women aged 30 years and older, and should be used in women of any age who have unclear Pap test results. After the age of 30, women should have HPV testing at the same frequency as a Pap test.   Colorectal cancer can be detected and often prevented. Most routine colorectal cancer screening begins at the age of 50 and continues through age 75. However, your caregiver may recommend screening at an earlier age if you have risk factors for colon cancer. On a yearly basis, your caregiver may provide home test kits to check for hidden blood in the stool. Use of a small camera at  the end of a tube, to directly examine the colon (sigmoidoscopy or colonoscopy), can detect the earliest forms of colorectal cancer. Talk to your caregiver about this at age 50, when routine screening begins. Direct examination of the colon should be repeated every 5 to 10 years through age 75, unless early forms of pre-cancerous polyps or small growths are found.   Hepatitis C blood testing is recommended for all people born from 1945 through 1965 and any individual with known risks for hepatitis C.   Practice safe sex. Use condoms and avoid high-risk sexual practices to reduce the spread of sexually transmitted infections (STIs). Sexually active women aged 25 and younger should be checked for Chlamydia, which is a common sexually transmitted infection. Older women with new or multiple partners should also be tested for Chlamydia. Testing for other   STIs is recommended if you are sexually active and at increased risk.   Osteoporosis is a disease in which the bones lose minerals and strength with aging. This can result in serious bone fractures. The risk of osteoporosis can be identified using a bone density scan. Women ages 13 and over and women at risk for fractures or osteoporosis should discuss screening with their caregivers. Ask your caregiver whether you should be taking a calcium supplement or vitamin D to reduce the rate of osteoporosis.   Menopause can be associated with physical symptoms and risks. Hormone replacement therapy is available to decrease symptoms and risks. You should talk to your caregiver about whether hormone replacement therapy is right for you.   Use sunscreen with a sun protection factor (SPF) of 30 or greater. Apply sunscreen liberally and repeatedly throughout the day. You should seek shade when your shadow is shorter than you. Protect yourself by wearing long sleeves, pants, a wide-brimmed hat, and sunglasses year round, whenever you are outdoors.   Notify your caregiver  of new moles or changes in moles, especially if there is a change in shape or color. Also notify your caregiver if a mole is larger than the size of a pencil eraser.   Stay current with your immunizations.  Document Released: 10/09/2010 Document Revised: 03/15/2011 Document Reviewed: 10/09/2010 [employer] Patient Information 2012 [place], [place].                                                                                 Herpes Zoster Virus Vaccine  What is this medicine? HERPES ZOSTER VIRUS VACCINE (HUR peez ZOS ter vahy ruhs vak SEEN) is a vaccine. It is used to prevent shingles in adults 55 years old and over. This vaccine is not used to treat shingles or nerve pain from shingles. This medicine may be used for other purposes; ask your health care provider or pharmacist if you have questions. What should I tell my health care provider before I take this medicine? They need to know if you have any of these conditions: -cancer like leukemia or lymphoma -immune system problems or therapy -infection with fever -tuberculosis -an unusual or allergic reaction to vaccines, neomycin, gelatin, other medicines, foods, dyes, or preservatives -pregnant or trying to get pregnant -breast-feeding How should I use this medicine? This vaccine is for injection under the skin. It is given by a health care professional. Talk to your pediatrician regarding the use of this medicine in children. This medicine is not approved for use in children. Overdosage: If you think you have taken too much of this medicine contact a poison control center or emergency room at once. NOTE: This medicine is only for you. Do not share this medicine with others. What if I miss a dose? This does not apply. What may interact with this medicine? Do not take this medicine with any of the following medications: -adalimumab -anakinra -etanercept -infliximab -medicines to treat cancer -medicines that suppress your immune  system This medicine may also interact with the following medications: -immunoglobulins -steroid medicines like prednisone or cortisone This list may not describe all possible interactions. Give your health care provider a list of all the medicines,  herbs, non-prescription drugs, or dietary supplements you use. Also tell them if you smoke, drink alcohol, or use illegal drugs. Some items may interact with your medicine. What should I watch for while using this medicine? Visit your doctor for regular check ups. This vaccine, like all vaccines, may not fully protect everyone. After receiving this vaccine it may be possible to pass chickenpox infection to others. Avoid people with immune system problems, pregnant women who have not had chickenpox, and newborns of women who have not had chickenpox. Talk to your doctor for more information. What side effects may I notice from receiving this medicine? Side effects that you should report to your doctor or health care professional as soon as possible: -allergic reactions like skin rash, itching or hives, swelling of the face, lips, or tongue -breathing problems -feeling faint or lightheaded, falls -fever, flu-like symptoms -pain, tingling, numbness in the hands or feet -swelling of the ankles, feet, hands -unusually weak or tired Side effects that usually do not require medical attention (report to your doctor or health care professional if they continue or are bothersome): -aches or pains -chickenpox-like rash -diarrhea -headache -loss of appetite -nausea, vomiting -redness, pain, swelling at site where injected -runny nose This list may not describe all possible side effects. Call your doctor for medical advice about side effects. You may report side effects to FDA at 1-800-FDA-1088. Where should I keep my medicine? This drug is given in a hospital or clinic and will not be stored at home. NOTE: This sheet is a summary. It may not cover all  possible information. If you have questions about this medicine, talk to your doctor, pharmacist, or health care provider.  2012, [employer]. (09/12/2009 5:43:50 PM)

## 2011-09-19 NOTE — Progress Notes (Signed)
JAMARIYA DAVIDOFF [birth year] [id number]   History:    56 y.o.  for annual gyn exam patient with prior history of TVH/BSO. Patient currently does her self breast examination. Patient with normal mammogram this year. Patient's exercising and eating well and has lost 20 pounds is last year. Her last colonoscopy was normal in 2009. Patient has been on Femring 0.05 mg which she applies every 3 months her vasomotor symptoms. Patient was one month without the Femring and begin to experience vasomotor symptoms again.  Past medical history,surgical history, family history and social history were all reviewed and documented in the EPIC chart.  Gynecologic History No LMP recorded. Patient has had a hysterectomy. Contraception: none Last Pap: 2012. Results were: normal Last mammogram: 2013. Results were: normal  Obstetric History OB History    Grav Para Term Preterm Abortions TAB SAB Ect Mult Living   4 2 2  2  2   2      # Outc Date GA Lbr Len/2nd Wgt Sex Del Anes PTL Lv   1 TRM     M SVD  No Yes   2 TRM     F SVD  No Yes   3 SAB            4 SAB                ROS: A ROS was performed and pertinent positives and negatives are included in the history.  GENERAL: No fevers or chills. HEENT: No change in vision, no earache, sore throat or sinus congestion. NECK: No pain or stiffness. CARDIOVASCULAR: No chest pain or pressure. No palpitations. PULMONARY: No shortness of breath, cough or wheeze. GASTROINTESTINAL: No abdominal pain, nausea, vomiting or diarrhea, melena or bright red blood per rectum. GENITOURINARY: No urinary frequency, urgency, hesitancy or dysuria. MUSCULOSKELETAL: No joint or muscle pain, no back pain, no recent trauma. DERMATOLOGIC: No rash, no itching, no lesions. ENDOCRINE: No polyuria, polydipsia, no heat or cold intolerance. No recent change in weight. HEMATOLOGICAL: No anemia or easy bruising or bleeding. NEUROLOGIC: No headache, seizures, numbness, tingling or weakness. PSYCHIATRIC:  No depression, no loss of interest in normal activity or change in sleep pattern.     Exam: chaperone present  BP 116/82  Ht 5\' 9"  (1.753 m)  Wt 169 lb (76.658 kg)  BMI 24.96 kg/m2  Body mass index is 24.96 kg/(m^2).  General appearance : Well developed well nourished female. No acute distress HEENT: Neck supple, trachea midline, no carotid bruits, no thyroidmegaly Lungs: Clear to auscultation, no rhonchi or wheezes, or rib retractions  Heart: Regular rate and rhythm, no murmurs or gallops Breast:Examined in sitting and supine position were symmetrical in appearance, no palpable masses or tenderness,  no skin retraction, no nipple inversion, no nipple discharge, no skin discoloration, no axillary or supraclavicular lymphadenopathy Abdomen: no palpable masses or tenderness, no rebound or guarding Extremities: no edema or skin discoloration or tenderness  Pelvic:  Bartholin, Urethra, Skene Glands: Within normal limits             Vagina: No gross lesions or discharge  Cervix: Absent Uterus absent  Adnexa  Without masses or tenderness  Anus and perineum  normal   Rectovaginal  normal sphincter tone without palpated masses or tenderness             Hemoccult fecal occult blood testing done today in the office result pending     Assessment/Plan:  56 y.o. female for annual exam who will  have her labs drawn with her primary provider in the next few weeks. We had discussed to begin tapering her estrogen starting next year since she has been on estrogen replacement therapy over 6 years. She was instructed to continue to do her monthly self breast examinations. We discussed importance of calcium vitamin D for osteoporosis prevention. She was given a prescription to have her zoster vaccine. Prescription refill for Femring 0.05 to apply vaginally every 3 months was provided. She will schedule a bone density study here in the office in the next several weeks. Her last bone density study here in  our office on April 2010 had evidence of normal bone mineralization. We discussed the new past year screening guidelines and she wanted to have a Pap smear done today. If normal I explained her that since she has had a hysterectomy and no prior history of high-grade dysplasia that she will no longer need Pap smears according to the new guidelines. Will followup in one year or when necessary.    Ok Edwards MD, 10:03 AM 09/19/2011

## 2011-09-19 NOTE — Addendum Note (Signed)
Addended by: Bertram Savin A on: 09/19/2011 10:16 AM   Modules accepted: Orders

## 2011-09-20 ENCOUNTER — Other Ambulatory Visit: Payer: Self-pay | Admitting: Gynecology

## 2011-09-20 DIAGNOSIS — Z1211 Encounter for screening for malignant neoplasm of colon: Secondary | ICD-10-CM

## 2012-09-23 ENCOUNTER — Encounter: Payer: Self-pay | Admitting: Gynecology

## 2012-09-24 ENCOUNTER — Encounter: Payer: Self-pay | Admitting: Gynecology

## 2012-09-25 ENCOUNTER — Encounter: Payer: Self-pay | Admitting: Gynecology

## 2012-10-01 ENCOUNTER — Encounter: Payer: Self-pay | Admitting: Gynecology

## 2012-10-01 ENCOUNTER — Ambulatory Visit (INDEPENDENT_AMBULATORY_CARE_PROVIDER_SITE_OTHER): Payer: BC Managed Care – PPO | Admitting: Gynecology

## 2012-10-01 VITALS — BP 118/78 | Ht 68.5 in | Wt 173.0 lb

## 2012-10-01 DIAGNOSIS — Z860100 Personal history of colon polyps, unspecified: Secondary | ICD-10-CM | POA: Insufficient documentation

## 2012-10-01 DIAGNOSIS — Z780 Asymptomatic menopausal state: Secondary | ICD-10-CM | POA: Insufficient documentation

## 2012-10-01 DIAGNOSIS — Z79890 Hormone replacement therapy (postmenopausal): Secondary | ICD-10-CM

## 2012-10-01 DIAGNOSIS — N951 Menopausal and female climacteric states: Secondary | ICD-10-CM

## 2012-10-01 DIAGNOSIS — Z01419 Encounter for gynecological examination (general) (routine) without abnormal findings: Secondary | ICD-10-CM

## 2012-10-01 MED ORDER — ESTRADIOL ACETATE 0.05 MG/24HR VA RING: 1.0000 | VAGINAL_RING | VAGINAL | Status: DC

## 2012-10-01 NOTE — Patient Instructions (Addendum)

## 2012-10-01 NOTE — Progress Notes (Signed)
Ellen Yu May 22, 1955 [id number]   History:    57 y.o.  for annual gyn exam with prior history of TVH BSO. Patient is doing well on Femring 0.05 mg every 3 months. She has informed me that she did not have her prescription and did not take it for 2 weeks and had no symptoms and wondered if she can come off of it. She has been on hormone replacement therapy for close to 10 years. Review of patient's records indicated that in 2009 she had a colonoscopy and benign polyps were noted. She is due for followup. Her mammogram was normal in June of this year. She frequency does for breast exams. Her last bone density study was in 2006. Her PCP at [facility] has been doing her lab work.  Past medical history,surgical history, family history and social history were all reviewed and documented in the EPIC chart.  Gynecologic History No LMP recorded. Patient has had a hysterectomy. Contraception: status post hysterectomy Last Pap: 2013. Results were: normal Last mammogram: see above. Results were: normal  Obstetric History OB History   Grav Para Term Preterm Abortions TAB SAB Ect Mult Living   4 2 2  2  2   2      # Outc Date GA Lbr Len/2nd Wgt Sex Del Anes PTL Lv   1 TRM     M SVD  No Yes   2 TRM     F SVD  No Yes   3 SAB            4 SAB                ROS: A ROS was performed and pertinent positives and negatives are included in the history.  GENERAL: No fevers or chills. HEENT: No change in vision, no earache, sore throat or sinus congestion. NECK: No pain or stiffness. CARDIOVASCULAR: No chest pain or pressure. No palpitations. PULMONARY: No shortness of breath, cough or wheeze. GASTROINTESTINAL: No abdominal pain, nausea, vomiting or diarrhea, melena or bright red blood per rectum. GENITOURINARY: No urinary frequency, urgency, hesitancy or dysuria. MUSCULOSKELETAL: No joint or muscle pain, no back pain, no recent trauma. DERMATOLOGIC: No rash, no itching, no lesions. ENDOCRINE: No  polyuria, polydipsia, no heat or cold intolerance. No recent change in weight. HEMATOLOGICAL: No anemia or easy bruising or bleeding. NEUROLOGIC: No headache, seizures, numbness, tingling or weakness. PSYCHIATRIC: No depression, no loss of interest in normal activity or change in sleep pattern.     Exam: chaperone present  BP 118/78  Ht 5' 8.5" (1.74 m)  Wt 173 lb (78.472 kg)  BMI 25.92 kg/m2  Body mass index is 25.92 kg/(m^2).  General appearance : Well developed well nourished female. No acute distress HEENT: Neck supple, trachea midline, no carotid bruits, no thyroidmegaly Lungs: Clear to auscultation, no rhonchi or wheezes, or rib retractions  Heart: Regular rate and rhythm, no murmurs or gallops Breast:Examined in sitting and supine position were symmetrical in appearance, no palpable masses or tenderness,  no skin retraction, no nipple inversion, no nipple discharge, no skin discoloration, no axillary or supraclavicular lymphadenopathy Abdomen: no palpable masses or tenderness, no rebound or guarding Extremities: no edema or skin discoloration or tenderness  Pelvic:  Bartholin, Urethra, Skene Glands: Within normal limits             Vagina: No gross lesions or discharge  Cervix: absent  Uterus  Absent  Adnexa  Without masses or tenderness  Anus and perineum  normal   Rectovaginal  normal sphincter tone without palpated masses or tenderness             Hemoccult Cord provided     Assessment/Plan:  57 y.o. female for annual exam who will have her lab drawn by her primary physician. She will check on her Tdap vaccine and previous hep C testing as recommended by the CBC. She will schedule a bone density study here in our office. We discussed importance of calcium vitamin D and regular exercise for osteoporosis prevention. She was reminded to submit to the office the medical records for testing. No Pap smear done today the new guidelines were discussed.    Ok Edwards MD,  12:58 PM 10/01/2012

## 2012-10-14 ENCOUNTER — Encounter: Payer: Self-pay | Admitting: Gynecology

## 2013-09-29 ENCOUNTER — Encounter: Payer: Self-pay | Admitting: Gynecology

## 2013-10-16 ENCOUNTER — Encounter: Payer: Self-pay | Admitting: Gynecology

## 2013-10-16 ENCOUNTER — Ambulatory Visit (INDEPENDENT_AMBULATORY_CARE_PROVIDER_SITE_OTHER): Payer: BC Managed Care – PPO | Admitting: Gynecology

## 2013-10-16 VITALS — BP 126/80 | Ht 69.0 in | Wt 171.0 lb

## 2013-10-16 DIAGNOSIS — Z01419 Encounter for gynecological examination (general) (routine) without abnormal findings: Secondary | ICD-10-CM

## 2013-10-16 MED ORDER — ESTRADIOL ACETATE 0.05 MG/24HR VA RING: 1.0000 | VAGINAL_RING | VAGINAL | Status: DC

## 2013-10-16 NOTE — Progress Notes (Signed)
Ellen Yu 1955/05/09 [id number]   History:    58 y.o.  for annual gyn exam with no complaints today. Patient with prior history of TVH BSO. Patient is doing well on Femring 0.05 mg every 3 months. Patient has been on ERT approximately 10 years now.Review of patient's records indicated that in 2009 she had a colonoscopy and benign polyps were noted. She is due for followup. She frequency does for breast exams. Her last bone density study was in 2006. Her PCP at [facility] has been doing her lab work. Patient with no prior history of abnormal Pap smear.   Past medical history,surgical history, family history and social history were all reviewed and documented in the EPIC chart.  Gynecologic History No LMP recorded. Patient has had a hysterectomy. Contraception: status post hysterectomy Last Pap: 2013. Results were: normal Last mammogram: 2015. Results were: normal  Obstetric History OB History  Gravida Para Term Preterm AB SAB TAB Ectopic Multiple Living  4 2 2  2 2    2     # Outcome Date GA Lbr Len/2nd Weight Sex Delivery Anes PTL Lv  4 SAB           3 SAB           2 TRM     F SVD  N Y  1 TRM     M SVD  N Y       ROS: A ROS was performed and pertinent positives and negatives are included in the history.  GENERAL: No fevers or chills. HEENT: No change in vision, no earache, sore throat or sinus congestion. NECK: No pain or stiffness. CARDIOVASCULAR: No chest pain or pressure. No palpitations. PULMONARY: No shortness of breath, cough or wheeze. GASTROINTESTINAL: No abdominal pain, nausea, vomiting or diarrhea, melena or bright red blood per rectum. GENITOURINARY: No urinary frequency, urgency, hesitancy or dysuria. MUSCULOSKELETAL: No joint or muscle pain, no back pain, no recent trauma. DERMATOLOGIC: No rash, no itching, no lesions. ENDOCRINE: No polyuria, polydipsia, no heat or cold intolerance. No recent change in weight. HEMATOLOGICAL: No anemia or easy bruising or  bleeding. NEUROLOGIC: No headache, seizures, numbness, tingling or weakness. PSYCHIATRIC: No depression, no loss of interest in normal activity or change in sleep pattern.     Exam: chaperone present  BP 126/80  Ht 5\' 9"  (1.753 m)  Wt 171 lb (77.565 kg)  BMI 25.24 kg/m2  Body mass index is 25.24 kg/(m^2).  General appearance : Well developed well nourished female. No acute distress HEENT: Neck supple, trachea midline, no carotid bruits, no thyroidmegaly Lungs: Clear to auscultation, no rhonchi or wheezes, or rib retractions  Heart: Regular rate and rhythm, no murmurs or gallops Breast:Examined in sitting and supine position were symmetrical in appearance, no palpable masses or tenderness,  no skin retraction, no nipple inversion, no nipple discharge, no skin discoloration, no axillary or supraclavicular lymphadenopathy Abdomen: Small reducible umbilical hernia  Extremities: no edema or skin discoloration or tenderness  Pelvic:  Bartholin, Urethra, Skene Glands: Within normal limits             Vagina: No gross lesions or discharge  Cervix: Absent  Uterus absent  Adnexa  Without masses or tenderness  Anus and perineum  normal   Rectovaginal  normal sphincter tone without palpated masses or tenderness             Hemoccult colonoscopy this year     Assessment/Plan:  58 y.o. female for annual  exam who has been on ERT for over 10 years. We discussed the women's health initiative study as well as the recommendations by the [employer] of [employer]ian gynecologist as well as [place] menopausal society. She is going to make an effort to come off of it and monitor her symptoms. She will begin taking more soy products exercising to see if this will help if she does become symptomatic it it's only an issue for vaginal atrophy we discussed vaginal estrogen twice a week. Her spouse died this year and she is not sexually active. Her PCP will be doing her blood work. Patient with small  reducible asymptomatic umbilical hernia. Patient will be scheduled for colonoscopy this year which is overdue. She will also schedule her bone density study in the next few weeks. Her vaccines are up-to-date. Pap smear not done today and according to the The new guidelines. Next year she wishes to come off her Prozac since her husband died this year she will hold off.  Note: This dictation was prepared with  Dragon/digital dictation along withSmart phrase technology. Any transcriptional errors that result from this process are unintentional.   Ok EdwardsFERNANDEZ,JUAN H MD, 2:58 PM 10/16/2013

## 2013-10-16 NOTE — Patient Instructions (Signed)
Bone Densitometry Bone densitometry is a special X-ray that measures your bone density and can be used to help predict your risk of bone fractures. This test is used to determine bone mineral content and density to diagnose osteoporosis. Osteoporosis is the loss of bone that may cause the bone to become weak. Osteoporosis commonly occurs in women entering menopause. However, it may be found in men and in people with other diseases. PREPARATION FOR TEST No preparation necessary. WHO SHOULD BE TESTED?  All women older than 65.  Postmenopausal women (50 to 65) with risk factors for osteoporosis.  People with a previous fracture caused by normal activities.  People with a small body frame (less than 127 poundsor a body mass index [BMI] of less than 21).  People who have a parent with a hip fracture or history of osteoporosis.  People who smoke.  People who have rheumatoid arthritis.  Anyone who engages in excessive alcohol use (more than 3 drinks most days).  Women who experience early menopause. WHEN SHOULD YOU BE RETESTED? Current guidelines suggest that you should wait at least 2 years before doing a bone density test again if your first test was normal.Recent studies indicated that women with normal bone density may be able to wait a few years before needing to repeat a bone density test. You should discuss this with your caregiver.  NORMAL FINDINGS   Normal: less than standard deviation below normal (greater than -1).  Osteopenia: 1 to 2.5 standard deviations below normal (-1 to -2.5).  Osteoporosis: greater than 2.5 standard deviations below normal (less than -2.5). Test results are reported as a "T score" and a "Z score."The T score is a number that compares your bone density with the bone density of healthy, young women.The Z score is a number that compares your bone density with the scores of women who are the same age, gender, and race.  Ranges for normal findings may vary  among different laboratories and hospitals. You should always check with your doctor after having lab work or other tests done to discuss the meaning of your test results and whether your values are considered within normal limits. MEANING OF TEST  Your caregiver will go over the test results with you and discuss the importance and meaning of your results, as well as treatment options and the need for additional tests if necessary. OBTAINING THE TEST RESULTS It is your responsibility to obtain your test results. Ask the lab or department performing the test when and how you will get your results. Document Released: 04/17/2004 Document Revised: 06/18/2011 Document Reviewed: 05/10/2010 ExitCare Patient Information 2015 [employer]. This information is not intended to replace advice given to you by your health care provider. Make sure you discuss any questions you have with your health care provider. Colonoscopy A colonoscopy is an exam to look at the entire large intestine (colon). This exam can help find problems such as tumors, polyps, inflammation, and areas of bleeding. The exam takes about 1 hour.  LET YOUR HEALTH CARE PROVIDER KNOW ABOUT:  Any allergies you have. All medicines you are taking, including vitamins, herbs, eye drops, creams, and over-the-counter medicines. Previous problems you or members of your family have had with the use of anesthetics. Any blood disorders you have. Previous surgeries you have had. Medical conditions you have. RISKS AND COMPLICATIONS  Generally, this is a safe procedure. However, as with any procedure, complications can occur. Possible complications include: Bleeding. Tearing or rupture of the colon wall.   Reaction to medicines given during the exam. Infection (rare). BEFORE THE PROCEDURE  Ask your health care provider about changing or stopping your regular medicines. You may be prescribed an oral bowel prep. This involves drinking a large amount of  medicated liquid, starting the day before your procedure. The liquid will cause you to have multiple loose stools until your stool is almost clear or light green. This cleans out your colon in preparation for the procedure. Do not eat or drink anything else once you have started the bowel prep, unless your health care provider tells you it is safe to do so. Arrange for someone to drive you home after the procedure. PROCEDURE  You will be given medicine to help you relax (sedative). You will lie on your side with your knees bent. A long, flexible tube with a light and camera on the end (colonoscope) will be inserted through the rectum and into the colon. The camera sends video back to a computer screen as it moves through the colon. The colonoscope also releases carbon dioxide gas to inflate the colon. This helps your health care provider see the area better. During the exam, your health care provider may take a small tissue sample (biopsy) to be examined under a microscope if any abnormalities are found. The exam is finished when the entire colon has been viewed. AFTER THE PROCEDURE  Do not drive for 24 hours after the exam. You may have a small amount of blood in your stool. You may pass moderate amounts of gas and have mild abdominal cramping or bloating. This is caused by the gas used to inflate your colon during the exam. Ask when your test results will be ready and how you will get your results. Make sure you get your test results. Document Released: 03/23/2000 Document Revised: 01/14/2013 Document Reviewed: 12/01/2012 ExitCare Patient Information 2015 ExitCare[employer]. This information is not intended to replace advice given to you by your health care provider. Make sure you discuss any questions you have with your health care provider.  

## 2013-10-27 ENCOUNTER — Other Ambulatory Visit: Payer: Self-pay | Admitting: Gynecology

## 2013-10-27 ENCOUNTER — Ambulatory Visit (INDEPENDENT_AMBULATORY_CARE_PROVIDER_SITE_OTHER): Payer: BC Managed Care – PPO

## 2013-10-27 DIAGNOSIS — Z780 Asymptomatic menopausal state: Secondary | ICD-10-CM

## 2013-10-27 DIAGNOSIS — Z13820 Encounter for screening for osteoporosis: Secondary | ICD-10-CM

## 2013-12-02 ENCOUNTER — Telehealth: Payer: Self-pay | Admitting: *Deleted

## 2013-12-02 NOTE — Telephone Encounter (Signed)
(  pt aware you are out of the office) Pt has stopped using femring, spoke with you about vaginal estrogen cream at annual. Pt would like Rx. Please advise

## 2013-12-03 NOTE — Telephone Encounter (Signed)
Please call in vaginal Estradiol 0.02% from [facility]) to apply twice weekly intravaginal. # 3 months supply with 4 refills. She should be off FemRing by now.

## 2013-12-04 MED ORDER — NONFORMULARY OR COMPOUNDED ITEM: Status: DC

## 2013-12-04 NOTE — Telephone Encounter (Signed)
Left message for pt to call.

## 2013-12-04 NOTE — Telephone Encounter (Signed)
Pt informed, rx called in 

## 2014-02-08 ENCOUNTER — Encounter: Payer: Self-pay | Admitting: Gynecology

## 2014-03-03 ENCOUNTER — Ambulatory Visit (INDEPENDENT_AMBULATORY_CARE_PROVIDER_SITE_OTHER): Payer: BC Managed Care – PPO | Admitting: Gynecology

## 2014-03-03 ENCOUNTER — Encounter: Payer: Self-pay | Admitting: Gynecology

## 2014-03-03 VITALS — BP 128/82

## 2014-03-03 DIAGNOSIS — Z23 Encounter for immunization: Secondary | ICD-10-CM

## 2014-03-03 DIAGNOSIS — Z780 Asymptomatic menopausal state: Secondary | ICD-10-CM

## 2014-03-03 DIAGNOSIS — N816 Rectocele: Secondary | ICD-10-CM

## 2014-03-03 NOTE — Patient Instructions (Signed)
Kegel Exercises The goal of Kegel exercises is to isolate and exercise your pelvic floor muscles. These muscles act as a hammock that supports the rectum, vagina, small intestine, and uterus. As the muscles weaken, the hammock sags and these organs are displaced from their normal positions. Kegel exercises can strengthen your pelvic floor muscles and help you to improve bladder and bowel control, improve sexual response, and help reduce many problems and some discomfort during pregnancy. Kegel exercises can be done anywhere and at any time. HOW TO PERFORM KEGEL EXERCISES 1. Locate your pelvic floor muscles. To do this, squeeze (contract) the muscles that you use when you try to stop the flow of urine. You will feel a tightness in the vaginal area (women) and a tight lift in the rectal area (men and women). 2. When you begin, contract your pelvic muscles tight for 2-5 seconds, then relax them for 2-5 seconds. This is one set. Do 4-5 sets with a short pause in between. 3. Contract your pelvic muscles for 8-10 seconds, then relax them for 8-10 seconds. Do 4-5 sets. If you cannot contract your pelvic muscles for 8-10 seconds, try 5-7 seconds and work your way up to 8-10 seconds. Your goal is 4-5 sets of 10 contractions each day. Keep your stomach, buttocks, and legs relaxed during the exercises. Perform sets of both short and long contractions. Vary your positions. Perform these contractions 3-4 times per day. Perform sets while you are:   Lying in bed in the morning.  Standing at lunch.  Sitting in the late afternoon.  Lying in bed at night. You should do 40-50 contractions per day. Do not perform more Kegel exercises per day than recommended. Overexercising can cause muscle fatigue. Continue these exercises for for at least 15-20 weeks or as directed by your caregiver. Document Released: 03/12/2012 Document Reviewed: 03/12/2012 [facility] Patient Information 2015 [place]. This information is  not intended to replace advice given to you by your health care provider. Make sure you discuss any questions you have with your health care provider. About Rectocele  Overview  A rectocele is a type of hernia which causes different degrees of bulging of the rectal tissues into the vaginal wall.  You may even notice that it presses against the vaginal wall so much that some vaginal tissues droop outside of the opening of your vagina.  Causes of Rectocele  The most common cause is childbirth.  The muscles and ligaments in the pelvis that hold up and support the female organs and vagina become stretched and weakened during labor and delivery.  The more babies you have, the more the support tissues are stretched and weakened.  Not everyone who has a baby will develop a rectocele.  Some women have stronger supporting tissue in the pelvis and may not have as much of a problem as others.  Women who have a Cesarean section usually do not get rectocele's unless they pushed a long time prior to the cesarean delivery.  Other conditions that can cause a rectocele include chronic constipation, a chronic cough, a lot of heavy lifting, and obesity.  Older women may have this problem because the loss of female hormones causes the vaginal tissue to become weaker.  Symptoms  There may not be any symptoms.  If you do have symptoms, they may include:  Pelvic pressure in the rectal area  Protrusion of the lower part of the vagina through the opening of the vagina  Constipation and trapping of the stool,  making it difficult to have a bowel movement.  In severe cases, you may have to press on the lower part of your vagina to help push the stool out of you rectum.  This is called splinting to empty.  Diagnosing Rectocele  Your health care provider will ask about your symptoms and perform a pelvic exam.  S/he will ask you to bear down, pushing like you are having a bowel movement so as to see how far the lower part  of the vagina protrudes into the vagina and possible outside of the vagina.  Your provider will also ask you to contract the muscles of your pelvis (like you are stopping the stream in the middle of urinating) to determine the strength of your pelvic muscles.  Your provider may also do a rectal exam.  Treatment Options  If you do not have any symptoms, no treatment may be necessary.  Other treatment options include:  Pelvic floor exercises: Contracting the muscles in your genital area may help strengthen your muscles and support the organs.  Be sure to get proper exercise instruction from you physical therapist.  A pessary (removealbe pelvic support device) sometimes helps rectocele symptoms.  Surgery: Surgical repair may be necessary. In some cases the uterus may need to be taken out ( a hysterectomy) as well.  There are many types of surgery for pelvic support problems.  Look for physicians who specialize in repair procedures.  You can take care of yourself by:  Treating and preventing constipation  Avoiding heavy lifting, and lifting correctly (with your legs, not with you waist or back)  Treating a chronic cough or bronchitis  Not smoking  avoiding too much weight gain  Doing pelvic floor exercises   2007, [employer] Doc.33

## 2014-03-03 NOTE — Progress Notes (Signed)
   Patient is a 58 year old who presented to the office today stating that she was feeling her vagina to be narrowing and having at times difficulty completely evacuating after bowel movement. 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 Patient had been on Femring for approximately 12 years and discontinued in August as this year. She is currently using vaginal estrogen twice a week for vaginal atrophy. Patient is not sexually active. She has no urinary incontinence reported.  Exam: Bartholin urethra Skene glands within normal limits Vagina: Second-degree rectocele was evident Vaginal cuff intact Very mild cystocele noted Bimanual exam not done due to the fact the patient's had complete hysterectomy. Rectovaginal exam confirms second-degree rectocele  Assessment/plan: Patient with second-degree rectocele pictures were shown to the patient recommendation for posterior colporrhaphy. Patient would like to wait. She was provided with instructions on Kegel exercises as well as continuation of vaginal estrogen cream twice a week. When she becomes more symptomatic and would like to proceed with surgery she will notify the office. Patient otherwise scheduled to return to the office next year for annual exam or when necessary.

## 2014-03-03 NOTE — Addendum Note (Signed)
Addended by: Berna SpareASTILLO, BLANCA A on: 03/03/2014 08:44 AM   Modules accepted: Orders

## 2014-04-09 HISTORY — PX: IMPLANTATION BONE ANCHORED HEARING AID: SUR691

## 2014-10-19 ENCOUNTER — Encounter: Payer: Self-pay | Admitting: Gynecology

## 2014-10-19 ENCOUNTER — Ambulatory Visit (INDEPENDENT_AMBULATORY_CARE_PROVIDER_SITE_OTHER): Payer: BC Managed Care – PPO | Admitting: Gynecology

## 2014-10-19 VITALS — BP 136/88 | Ht 68.5 in | Wt 176.0 lb

## 2014-10-19 DIAGNOSIS — Z01419 Encounter for gynecological examination (general) (routine) without abnormal findings: Secondary | ICD-10-CM | POA: Diagnosis not present

## 2014-10-19 DIAGNOSIS — N952 Postmenopausal atrophic vaginitis: Secondary | ICD-10-CM | POA: Diagnosis not present

## 2014-10-19 DIAGNOSIS — N816 Rectocele: Secondary | ICD-10-CM

## 2014-10-19 DIAGNOSIS — Z79890 Hormone replacement therapy (postmenopausal): Secondary | ICD-10-CM | POA: Diagnosis not present

## 2014-10-19 MED ORDER — NONFORMULARY OR COMPOUNDED ITEM: Status: DC

## 2014-10-19 NOTE — Patient Instructions (Signed)

## 2014-10-19 NOTE — Progress Notes (Signed)
Ellen Yu August 04, 1955 [id number]   History:    59 y.o.  for annual gyn exam with no complaints today. Patient with prior history of TVH BSO. Is currently on vaginal estrogenic twice a week for vaginal atrophy. Patient currently not sexually active. Patient with known rectocele not giving her any problem not interested in surgery at the present time. Her PCP with [facility] has been doing her lab work. Patient with no prior history of abnormal Pap smear. Patient with family history of colorectal cancer and patient herself had colon polyps removed in 2009. Her bone density study was normal in 2015.  Past medical history,surgical history, family history and social history were all reviewed and documented in the EPIC chart.  Gynecologic History No LMP recorded. Patient has had a hysterectomy. Contraception: status post hysterectomy Last Pap: 2013. Results were: normal Last mammogram: 2015. Results were: normal  Obstetric History OB History  Gravida Para Term Preterm AB SAB TAB Ectopic Multiple Living  # Outcome Date GA Lbr Len/2nd Weight Sex Delivery Anes PTL Lv  4 SAB           3 SAB           2 Term     F Vag-Spont  N Y  1 Term     M Vag-Spont  N Y       ROS: A ROS was performed and pertinent positives and negatives are included in the history.  GENERAL: No fevers or chills. HEENT: No change in vision, no earache, sore throat or sinus congestion. NECK: No pain or stiffness. CARDIOVASCULAR: No chest pain or pressure. No palpitations. PULMONARY: No shortness of breath, cough or wheeze. GASTROINTESTINAL: No abdominal pain, nausea, vomiting or diarrhea, melena or bright red blood per rectum. GENITOURINARY: No urinary frequency, urgency, hesitancy or dysuria. MUSCULOSKELETAL: No joint or muscle pain, no back pain, no recent trauma. DERMATOLOGIC: No rash, no itching, no lesions. ENDOCRINE: No polyuria, polydipsia, no heat or cold intolerance. No recent change in  weight. HEMATOLOGICAL: No anemia or easy bruising or bleeding. NEUROLOGIC: No headache, seizures, numbness, tingling or weakness. PSYCHIATRIC: No depression, no loss of interest in normal activity or change in sleep pattern.     Exam: chaperone present  BP 136/88 mmHg  Ht 5' 8.5" (1.74 m)  Wt 176 lb (79.833 kg)  BMI 26.37 kg/m2  Body mass index is 26.37 kg/(m^2).  General appearance : Well developed well nourished female. No acute distress HEENT: Eyes: no retinal hemorrhage or exudates,  Neck supple, trachea midline, no carotid bruits, no thyroidmegaly Lungs: Clear to auscultation, no rhonchi or wheezes, or rib retractions  Heart: Regular rate and rhythm, no murmurs or gallops Breast:Examined in sitting and supine position were symmetrical in appearance, no palpable masses or tenderness,  no skin retraction, no nipple inversion, no nipple discharge, no skin discoloration, no axillary or supraclavicular lymphadenopathy Abdomen: no palpable masses or tenderness, no rebound or guarding Extremities: no edema or skin discoloration or tenderness  Pelvic:  Bartholin, Urethra, Skene Glands: Within normal limits             Vagina: No gross lesions or discharge, atrophic changes, second-degree rectocele  Cervix: Absent  Uterus  absent  Adnexa  Without masses or tenderness  Anus and perineum  normal   Rectovaginal  normal sphincter tone without palpated masses or tenderness  Hemoccult colonoscopy this year     Assessment/Plan:  59 y.o. female for annual exam with stable second-degree rectocele not interested in surgical correction at the present time. Patient's PCP we'll be doing her blood work. Patient to schedule her overdue mammogram as well as her colonoscopy. Pap smear not done today in accordance to the new guidelines. Prescription refill for vaginal last region to apply twice a week was provided. We discussed importance of calcium vitamin D and regular exercise for  osteoporosis prevention.   Ok EdwardsFERNANDEZ,[name] H MD, 2:52 PM 10/19/2014

## 2014-11-01 ENCOUNTER — Encounter: Payer: Self-pay | Admitting: Gynecology

## 2014-11-04 ENCOUNTER — Encounter: Payer: Self-pay | Admitting: Gynecology

## 2014-11-22 ENCOUNTER — Encounter: Payer: Self-pay | Admitting: Gynecology

## 2015-02-18 ENCOUNTER — Other Ambulatory Visit: Payer: Self-pay | Admitting: Gynecology

## 2016-03-14 ENCOUNTER — Encounter: Payer: Self-pay | Admitting: Gynecology

## 2016-03-30 ENCOUNTER — Encounter: Payer: BC Managed Care – PPO | Admitting: Gynecology

## 2016-04-25 ENCOUNTER — Encounter: Payer: BC Managed Care – PPO | Admitting: Gynecology

## 2016-05-07 ENCOUNTER — Ambulatory Visit (INDEPENDENT_AMBULATORY_CARE_PROVIDER_SITE_OTHER): Payer: BC Managed Care – PPO | Admitting: Gynecology

## 2016-05-07 ENCOUNTER — Encounter: Payer: Self-pay | Admitting: Gynecology

## 2016-05-07 VITALS — BP 126/80 | Ht 68.0 in | Wt 181.0 lb

## 2016-05-07 DIAGNOSIS — Z780 Asymptomatic menopausal state: Secondary | ICD-10-CM

## 2016-05-07 DIAGNOSIS — Z9229 Personal history of other drug therapy: Secondary | ICD-10-CM

## 2016-05-07 DIAGNOSIS — Z01419 Encounter for gynecological examination (general) (routine) without abnormal findings: Secondary | ICD-10-CM | POA: Diagnosis not present

## 2016-05-07 DIAGNOSIS — N816 Rectocele: Secondary | ICD-10-CM | POA: Diagnosis not present

## 2016-05-07 MED ORDER — NONFORMULARY OR COMPOUNDED ITEM: 3 refills | Status: DC

## 2016-05-07 NOTE — Patient Instructions (Addendum)
Bone Densitometry Introduction Bone densitometry is an imaging test that uses a special X-ray to measure the amount of calcium and other minerals in your bones (bone density). This test is also known as a bone mineral density test or dual-energy X-ray absorptiometry (DXA). The test can measure bone density at your hip and your spine. It is similar to having a regular X-ray. You may have this test to:  Diagnose a condition that causes weak or thin bones (osteoporosis).  Predict your risk of a broken bone (fracture).  Determine how well osteoporosis treatment is working. Tell a health care provider about:  Any allergies you have.  All medicines you are taking, including vitamins, herbs, eye drops, creams, and over-the-counter medicines.  Any problems you or family members have had with anesthetic medicines.  Any blood disorders you have.  Any surgeries you have had.  Any medical conditions you have.  Possibility of pregnancy.  Any other medical test you had within the previous 14 days that used contrast material. What are the risks? Generally, this is a safe procedure. However, problems can occur and may include the following:  This test exposes you to a very small amount of radiation.  The risks of radiation exposure may be greater to unborn children. What happens before the procedure?  Do not take any calcium supplements for 24 hours before having the test. You can otherwise eat and drink what you usually do.  Take off all metal jewelry, eyeglasses, dental appliances, and any other metal objects. What happens during the procedure?  You may lie on an exam table. There will be an X-ray generator below you and an imaging device above you.  Other devices, such as boxes or braces, may be used to position your body properly for the scan.  You will need to lie still while the machine slowly scans your body.  The images will show up on a computer monitor. What happens after the  procedure? You may need more testing at a later time. This information is not intended to replace advice given to you by your health care provider. Make sure you discuss any questions you have with your health care provider. Document Released: 04/17/2004 Document Revised: 09/01/2015 Document Reviewed: 09/03/2013  2017 Elsevier  Kegel Exercises The goal of Kegel exercises is to isolate and exercise your pelvic floor muscles. These muscles act as a hammock that supports the rectum, vagina, small intestine, and uterus. As the muscles weaken, the hammock sags and these organs are displaced from their normal positions. Kegel exercises can strengthen your pelvic floor muscles and help you to improve bladder and bowel control, improve sexual response, and help reduce many problems and some discomfort during pregnancy. Kegel exercises can be done anywhere and at any time. HOW TO PERFORM KEGEL EXERCISES 1. Locate your pelvic floor muscles. To do this, squeeze (contract) the muscles that you use when you try to stop the flow of urine. You will feel a tightness in the vaginal area (women) and a tight lift in the rectal area (men and women). 2. When you begin, contract your pelvic muscles tight for 2-5 seconds, then relax them for 2-5 seconds. This is one set. Do 4-5 sets with a short pause in between. 3. Contract your pelvic muscles for 8-10 seconds, then relax them for 8-10 seconds. Do 4-5 sets. If you cannot contract your pelvic muscles for 8-10 seconds, try 5-7 seconds and work your way up to 8-10 seconds. Your goal is 4-5 sets of  10 contractions each day. Keep your stomach, buttocks, and legs relaxed during the exercises. Perform sets of both short and long contractions. Vary your positions. Perform these contractions 3-4 times per day. Perform sets while you are:   Lying in bed in the morning.  Standing at lunch.  Sitting in the late afternoon.  Lying in bed at night. You should do 40-50  contractions per day. Do not perform more Kegel exercises per day than recommended. Overexercising can cause muscle fatigue. Continue these exercises for for at least 15-20 weeks or as directed by your caregiver. This information is not intended to replace advice given to you by your health care provider. Make sure you discuss any questions you have with your health care provider. Document Released: 03/12/2012 Document Revised: 04/16/2014 Document Reviewed: 02/13/2015 Elsevier Interactive Patient Education  2017 [employer]

## 2016-05-07 NOTE — Progress Notes (Signed)
Ellen FlamingBarbara M Yu 1955-09-04 [id number]   History:    61 y.o.  for annual gyn exam who is asymptomatic today. Patient has had a prior TVH BSO. Is currently on vaginal estrogenic twice a week for vaginal atrophy. Patient currently not sexually active. Patient with known rectocele not giving her any problem not interested in surgery at the present time. Her PCP with [facility] has been doing her lab work. Patient with no prior history of abnormal Pap smear. Patient with family history of colorectal cancer and patient herself had colon polyps removed in 2009. Her bone density study was normal in 2015.  Past medical history,surgical history, family history and social history were all reviewed and documented in the EPIC chart.  Gynecologic History No LMP recorded. Patient has had a hysterectomy. Contraception: status post hysterectomy Last Pap: 2013. Results were: normal Last mammogram: 2017. Results were: normal  Obstetric History OB History  Gravida Para Term Preterm AB Living  4 2 2   2 2   SAB TAB Ectopic Multiple Live Births  2       2    # Outcome Date GA Lbr Len/2nd Weight Sex Delivery Anes PTL Lv  4 SAB           3 SAB           2 Term     F Vag-Spont  N LIV  1 Term     M Vag-Spont  N LIV       ROS: A ROS was performed and pertinent positives and negatives are included in the history.  GENERAL: No fevers or chills. HEENT: No change in vision, no earache, sore throat or sinus congestion. NECK: No pain or stiffness. CARDIOVASCULAR: No chest pain or pressure. No palpitations. PULMONARY: No shortness of breath, cough or wheeze. GASTROINTESTINAL: No abdominal pain, nausea, vomiting or diarrhea, melena or bright red blood per rectum. GENITOURINARY: No urinary frequency, urgency, hesitancy or dysuria. MUSCULOSKELETAL: No joint or muscle pain, no back pain, no recent trauma. DERMATOLOGIC: No rash, no itching, no lesions. ENDOCRINE: No polyuria, polydipsia, no heat or cold intolerance.  No recent change in weight. HEMATOLOGICAL: No anemia or easy bruising or bleeding. NEUROLOGIC: No headache, seizures, numbness, tingling or weakness. PSYCHIATRIC: No depression, no loss of interest in normal activity or change in sleep pattern.     Exam: chaperone present  BP 126/80   Ht 5\' 8"  (1.727 m)   Wt 181 lb (82.1 kg)   BMI 27.52 kg/m   Body mass index is 27.52 kg/m.  General appearance : Well developed well nourished female. No acute distress HEENT: Eyes: no retinal hemorrhage or exudates,  Neck supple, trachea midline, no carotid bruits, no thyroidmegaly Lungs: Clear to auscultation, no rhonchi or wheezes, or rib retractions  Heart: Regular rate and rhythm, no murmurs or gallops Breast:Examined in sitting and supine position were symmetrical in appearance, no palpable masses or tenderness,  no skin retraction, no nipple inversion, no nipple discharge, no skin discoloration, no axillary or supraclavicular lymphadenopathy Abdomen: no palpable masses or tenderness, no rebound or guarding Extremities: no edema or skin discoloration or tenderness  Pelvic:  Bartholin, Urethra, Skene Glands: Within normal limits             Vagina: No gross lesions or discharge, first to second-degree rectocele  Cervix: Absent  Uterus  absent  Adnexa  Without masses or tenderness  Anus and perineum  normal   Rectovaginal  normal sphincter tone without palpated  masses or tenderness             Hemoccult PCP provides     Assessment/Plan:  61 y.o. female for annual exam patient was stable first to second-degree rectocele. I've given her instructions on Kegel exercises. She reports normal bowel movements if she becomes symptomatic she would like to have surgical correction at that time. Prescription refill for her vaginal estrogen to apply twice a week was provided. Pap smear no longer indicated. She is overdue for bone density study for which she will schedule here in the office in the next few  weeks. We discussed importance of calcium vitamin D and weightbearing exercise for osteoporosis prevention. Her PCP has done her blood work and her vaccines are up-to-date.   Ok Edwards MD, 4:03 PM 05/07/2016

## 2016-05-28 ENCOUNTER — Other Ambulatory Visit: Payer: Self-pay | Admitting: Gynecology

## 2016-08-22 ENCOUNTER — Encounter: Payer: Self-pay | Admitting: Gynecology

## 2017-05-08 ENCOUNTER — Encounter: Payer: Self-pay | Admitting: Obstetrics & Gynecology

## 2017-05-08 ENCOUNTER — Ambulatory Visit: Payer: BC Managed Care – PPO | Admitting: Obstetrics & Gynecology

## 2017-05-08 VITALS — BP 126/82 | Ht 68.5 in | Wt 181.0 lb

## 2017-05-08 DIAGNOSIS — Z90710 Acquired absence of both cervix and uterus: Secondary | ICD-10-CM

## 2017-05-08 DIAGNOSIS — Z90722 Acquired absence of ovaries, bilateral: Secondary | ICD-10-CM

## 2017-05-08 DIAGNOSIS — Z780 Asymptomatic menopausal state: Secondary | ICD-10-CM

## 2017-05-08 DIAGNOSIS — N952 Postmenopausal atrophic vaginitis: Secondary | ICD-10-CM

## 2017-05-08 DIAGNOSIS — Z13820 Encounter for screening for osteoporosis: Secondary | ICD-10-CM | POA: Diagnosis not present

## 2017-05-08 DIAGNOSIS — Z9079 Acquired absence of other genital organ(s): Secondary | ICD-10-CM

## 2017-05-08 DIAGNOSIS — F5231 Female orgasmic disorder: Secondary | ICD-10-CM | POA: Diagnosis not present

## 2017-05-08 DIAGNOSIS — Z01411 Encounter for gynecological examination (general) (routine) with abnormal findings: Secondary | ICD-10-CM

## 2017-05-08 DIAGNOSIS — Z1151 Encounter for screening for human papillomavirus (HPV): Secondary | ICD-10-CM

## 2017-05-08 NOTE — Patient Instructions (Signed)
1. Encounter for gynecological examination with abnormal finding Gynecologic exam s/p total hysterectomy with bilateral salpingo-oophorectomy.  Pap test with high risk HPV done on the vaginal vault.  Breast exam normal.  Screening mammogram negative in December 2018.  Will do health labs with family physician.  Colonoscopy in 2016.  2. H/O total vaginal hysterectomy with bilateral salpingo-oophorectomy (BSO)  3. Menopause present Well on no systemic hormone replacement therapy.  4. Post-menopause atrophic vaginitis Using estradiol cream twice a week with good results.  Will continue.  Patient will call back when needs a re-prescription.  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  Importance of stimulation to clitoris discussed.  Recommend masturbation as a first step to reach orgasm.  6. Screening for osteoporosis Vitamin D supplements, calcium rich nutrition and regular weightbearing physical activity recommended.  Patient will schedule bone density here. - DG Bone Density; Future  Falisa, it was a pleasure meeting you today!  I will inform you of your results as soon as they are available.   Health Maintenance for Postmenopausal Women Menopause is a normal process in which your reproductive ability comes to an end. This process happens gradually over a span of months to years, usually between the ages of 81 and 39. Menopause is complete when you have missed 12 consecutive menstrual periods. It is important to talk with your health care provider about some of the most common conditions that affect postmenopausal women, such as heart disease, cancer, and bone loss (osteoporosis). Adopting a healthy lifestyle and getting preventive care can help to promote your health and wellness. Those actions can also lower your chances of developing some of these common conditions. What should I know about menopause? During menopause, you may experience a number of symptoms,  such as:  Moderate-to-severe hot flashes.  Night sweats.  Decrease in sex drive.  Mood swings.  Headaches.  Tiredness.  Irritability.  Memory problems.  Insomnia.  Choosing to treat or not to treat menopausal changes is an individual decision that you make with your health care provider. What should I know about hormone replacement therapy and supplements? Hormone therapy products are effective for treating symptoms that are associated with menopause, such as hot flashes and night sweats. Hormone replacement carries certain risks, especially as you become older. If you are thinking about using estrogen or estrogen with progestin treatments, discuss the benefits and risks with your health care provider. What should I know about heart disease and stroke? Heart disease, heart attack, and stroke become more likely as you age. This may be due, in part, to the hormonal changes that your body experiences during menopause. These can affect how your body processes dietary fats, triglycerides, and cholesterol. Heart attack and stroke are both medical emergencies. There are many things that you can do to help prevent heart disease and stroke:  Have your blood pressure checked at least every 1-2 years. High blood pressure causes heart disease and increases the risk of stroke.  If you are 33-29 years old, ask your health care provider if you should take aspirin to prevent a heart attack or a stroke.  Do not use any tobacco products, including cigarettes, chewing tobacco, or electronic cigarettes. If you need help quitting, ask your health care provider.  It is important to eat a healthy diet and maintain a healthy weight. ? Be sure to include plenty of vegetables, fruits, low-fat dairy products, and lean protein. ? Avoid eating foods that are high in solid fats,  added sugars, or salt (sodium).  Get regular exercise. This is one of the most important things that you can do for your  health. ? Try to exercise for at least 150 minutes each week. The type of exercise that you do should increase your heart rate and make you sweat. This is known as moderate-intensity exercise. ? Try to do strengthening exercises at least twice each week. Do these in addition to the moderate-intensity exercise.  Know your numbers.Ask your health care provider to check your cholesterol and your blood glucose. Continue to have your blood tested as directed by your health care provider.  What should I know about cancer screening? There are several types of cancer. Take the following steps to reduce your risk and to catch any cancer development as early as possible. Breast Cancer  Practice breast self-awareness. ? This means understanding how your breasts normally appear and feel. ? It also means doing regular breast self-exams. Let your health care provider know about any changes, no matter how small.  If you are 46 or older, have a clinician do a breast exam (clinical breast exam or CBE) every year. Depending on your age, family history, and medical history, it may be recommended that you also have a yearly breast X-ray (mammogram).  If you have a family history of breast cancer, talk with your health care provider about genetic screening.  If you are at high risk for breast cancer, talk with your health care provider about having an MRI and a mammogram every year.  Breast cancer (BRCA) gene test is recommended for women who have family members with BRCA-related cancers. Results of the assessment will determine the need for genetic counseling and BRCA1 and for BRCA2 testing. BRCA-related cancers include these types: ? Breast. This occurs in males or females. ? Ovarian. ? Tubal. This may also be called fallopian tube cancer. ? Cancer of the abdominal or pelvic lining (peritoneal cancer). ? Prostate. ? Pancreatic.  Cervical, Uterine, and Ovarian Cancer Your health care provider may recommend  that you be screened regularly for cancer of the pelvic organs. These include your ovaries, uterus, and vagina. This screening involves a pelvic exam, which includes checking for microscopic changes to the surface of your cervix (Pap test).  For women ages 21-65, health care providers may recommend a pelvic exam and a Pap test every three years. For women ages 37-65, they may recommend the Pap test and pelvic exam, combined with testing for human papilloma virus (HPV), every five years. Some types of HPV increase your risk of cervical cancer. Testing for HPV may also be done on women of any age who have unclear Pap test results.  Other health care providers may not recommend any screening for nonpregnant women who are considered low risk for pelvic cancer and have no symptoms. Ask your health care provider if a screening pelvic exam is right for you.  If you have had past treatment for cervical cancer or a condition that could lead to cancer, you need Pap tests and screening for cancer for at least 20 years after your treatment. If Pap tests have been discontinued for you, your risk factors (such as having a new sexual partner) need to be reassessed to determine if you should start having screenings again. Some women have medical problems that increase the chance of getting cervical cancer. In these cases, your health care provider may recommend that you have screening and Pap tests more often.  If you have a family  history of uterine cancer or ovarian cancer, talk with your health care provider about genetic screening.  If you have vaginal bleeding after reaching menopause, tell your health care provider.  There are currently no reliable tests available to screen for ovarian cancer.  Lung Cancer Lung cancer screening is recommended for adults 42-67 years old who are at high risk for lung cancer because of a history of smoking. A yearly low-dose CT scan of the lungs is recommended if you:  Currently  smoke.  Have a history of at least 30 pack-years of smoking and you currently smoke or have quit within the past 15 years. A pack-year is smoking an average of one pack of cigarettes per day for one year.  Yearly screening should:  Continue until it has been 15 years since you quit.  Stop if you develop a health problem that would prevent you from having lung cancer treatment.  Colorectal Cancer  This type of cancer can be detected and can often be prevented.  Routine colorectal cancer screening usually begins at age 5 and continues through age 74.  If you have risk factors for colon cancer, your health care provider may recommend that you be screened at an earlier age.  If you have a family history of colorectal cancer, talk with your health care provider about genetic screening.  Your health care provider may also recommend using home test kits to check for hidden blood in your stool.  A small camera at the end of a tube can be used to examine your colon directly (sigmoidoscopy or colonoscopy). This is done to check for the earliest forms of colorectal cancer.  Direct examination of the colon should be repeated every 5-10 years until age 46. However, if early forms of precancerous polyps or small growths are found or if you have a family history or genetic risk for colorectal cancer, you may need to be screened more often.  Skin Cancer  Check your skin from head to toe regularly.  Monitor any moles. Be sure to tell your health care provider: ? About any new moles or changes in moles, especially if there is a change in a mole's shape or color. ? If you have a mole that is larger than the size of a pencil eraser.  If any of your family members has a history of skin cancer, especially at a young age, talk with your health care provider about genetic screening.  Always use sunscreen. Apply sunscreen liberally and repeatedly throughout the day.  Whenever you are outside, protect  yourself by wearing long sleeves, pants, a wide-brimmed hat, and sunglasses.  What should I know about osteoporosis? Osteoporosis is a condition in which bone destruction happens more quickly than new bone creation. After menopause, you may be at an increased risk for osteoporosis. To help prevent osteoporosis or the bone fractures that can happen because of osteoporosis, the following is recommended:  If you are 19-45 years old, get at least 1,000 mg of calcium and at least 600 mg of vitamin D per day.  If you are older than age 100 but younger than age 4, get at least 1,200 mg of calcium and at least 600 mg of vitamin D per day.  If you are older than age 49, get at least 1,200 mg of calcium and at least 800 mg of vitamin D per day.  Smoking and excessive alcohol intake increase the risk of osteoporosis. Eat foods that are rich in calcium and vitamin D,  and do weight-bearing exercises several times each week as directed by your health care provider. What should I know about how menopause affects my mental health? Depression may occur at any age, but it is more common as you become older. Common symptoms of depression include:  Low or sad mood.  Changes in sleep patterns.  Changes in appetite or eating patterns.  Feeling an overall lack of motivation or enjoyment of activities that you previously enjoyed.  Frequent crying spells.  Talk with your health care provider if you think that you are experiencing depression. What should I know about immunizations? It is important that you get and maintain your immunizations. These include:  Tetanus, diphtheria, and pertussis (Tdap) booster vaccine.  Influenza every year before the flu season begins.  Pneumonia vaccine.  Shingles vaccine.  Your health care provider may also recommend other immunizations. This information is not intended to replace advice given to you by your health care provider. Make sure you discuss any questions you  have with your health care provider. Document Released: 05/18/2005 Document Revised: 10/14/2015 Document Reviewed: 12/28/2014 Elsevier Interactive Patient Education  2018 [employer].

## 2017-05-08 NOTE — Progress Notes (Signed)
Ellen Yu October 21, 1955 [id number]   History:    62 y.o. 254P2A2L2 Widowed.  New boyfriend  RP:  Established patient presenting for annual gyn exam   HPI: S/P Total Hysterectomy/BSO for Fibroids.  Menopause, well on no systemic HRT.  Using Estradiol cream twice a week.  Active sexully with her new boyfriend.  Per patient, he had a negative STD screen just before they started having sex.  Patient complains of not reaching orgasm.  Breasts wnl.  Urine/BMs wnl.  Health labs with Fam MD.  Past medical history,surgical history, family history and social history were all reviewed and documented in the EPIC chart.  Gynecologic History No LMP recorded. Patient has had a hysterectomy. Contraception: status post hysterectomy Last Pap: 2013. Results were: normal Last mammogram: 03/2017. Results were: Negative Bone Density: 2015 Colonoscopy: 2016  Obstetric History OB History  Gravida Para Term Preterm AB Living  4 2 2   2 2   SAB TAB Ectopic Multiple Live Births  2       2    # Outcome Date GA Lbr Len/2nd Weight Sex Delivery Anes PTL Lv  4 SAB           3 SAB           2 Term     F Vag-Spont  N LIV  1 Term     M Vag-Spont  N LIV       ROS: A ROS was performed and pertinent positives and negatives are included in the history.  GENERAL: No fevers or chills. HEENT: No change in vision, no earache, sore throat or sinus congestion. NECK: No pain or stiffness. CARDIOVASCULAR: No chest pain or pressure. No palpitations. PULMONARY: No shortness of breath, cough or wheeze. GASTROINTESTINAL: No abdominal pain, nausea, vomiting or diarrhea, melena or bright red blood per rectum. GENITOURINARY: No urinary frequency, urgency, hesitancy or dysuria. MUSCULOSKELETAL: No joint or muscle pain, no back pain, no recent trauma. DERMATOLOGIC: No rash, no itching, no lesions. ENDOCRINE: No polyuria, polydipsia, no heat or cold intolerance. No recent change in weight. HEMATOLOGICAL: No anemia or easy bruising  or bleeding. NEUROLOGIC: No headache, seizures, numbness, tingling or weakness. PSYCHIATRIC: No depression, no loss of interest in normal activity or change in sleep pattern.     Exam:   BP 126/82   Ht 5' 8.5" (1.74 m)   Wt 181 lb (82.1 kg)   BMI 27.12 kg/m   Body mass index is 27.12 kg/m.  General appearance : Well developed well nourished female. No acute distress HEENT: Eyes: no retinal hemorrhage or exudates,  Neck supple, trachea midline, no carotid bruits, no thyroidmegaly Lungs: Clear to auscultation, no rhonchi or wheezes, or rib retractions  Heart: Regular rate and rhythm, no murmurs or gallops Breast:Examined in sitting and supine position were symmetrical in appearance, no palpable masses or tenderness,  no skin retraction, no nipple inversion, no nipple discharge, no skin discoloration, no axillary or supraclavicular lymphadenopathy Abdomen: no palpable masses or tenderness, no rebound or guarding Extremities: no edema or skin discoloration or tenderness  Pelvic: Vulva: Normal             Vagina: No gross lesions or discharge.  Pap/HR HPV done.  Adnexa  Without masses or tenderness  Anus: Normal   Assessment/Plan:  62 y.o. female for annual exam   1. Encounter for gynecological examination with abnormal finding Gynecologic exam s/p total hysterectomy with bilateral salpingo-oophorectomy.  Pap test with high risk HPV done  on the vaginal vault.  Breast exam normal.  Screening mammogram negative in December 2018.  Will do health labs with family physician.  Colonoscopy in 2016.  2. H/O total vaginal hysterectomy with bilateral salpingo-oophorectomy (BSO)  3. Menopause present Well on no systemic hormone replacement therapy.  4. Post-menopause atrophic vaginitis Using estradiol cream twice a week with good results.  Will continue.  Patient will call back when needs a re-prescription.  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  Importance  of stimulation to clitoris discussed.  Recommend masturbation as a first step to reach orgasm.  6. Screening for osteoporosis Vitamin D supplements, calcium rich nutrition and regular weightbearing physical activity recommended.  Patient will schedule bone density here. - DG Bone Density; Future  Counseling on above issues more than 50% for 15 minutes.  Genia Del MD, 9:38 AM 05/08/2017

## 2017-05-10 LAB — PAP, TP IMAGING W/ HPV RNA, RFLX HPV TYPE 16,18/45: HPV DNA High Risk: NOT DETECTED

## 2017-08-01 ENCOUNTER — Other Ambulatory Visit: Payer: Self-pay

## 2017-08-01 MED ORDER — NONFORMULARY OR COMPOUNDED ITEM: 3 refills | Status: DC

## 2018-05-29 ENCOUNTER — Telehealth: Payer: Self-pay | Admitting: *Deleted

## 2018-05-29 MED ORDER — NONFORMULARY OR COMPOUNDED ITEM: 0 refills | Status: DC

## 2018-05-29 NOTE — Telephone Encounter (Signed)
Appointment scheduled for April 28 with Dr Seymour Bars.

## 2018-05-29 NOTE — Telephone Encounter (Signed)
Rx called in 

## 2018-05-29 NOTE — Telephone Encounter (Signed)
Patient called requesting refill on compound estradiol 0.02%, over due for annual exam. Will route to appointment desk to schedule then Rx will be called in.

## 2018-08-04 ENCOUNTER — Other Ambulatory Visit: Payer: Self-pay

## 2018-08-05 ENCOUNTER — Encounter: Payer: BC Managed Care – PPO | Admitting: Obstetrics & Gynecology

## 2018-08-07 ENCOUNTER — Ambulatory Visit: Payer: BC Managed Care – PPO | Admitting: Obstetrics & Gynecology

## 2018-08-07 ENCOUNTER — Encounter: Payer: Self-pay | Admitting: Obstetrics & Gynecology

## 2018-08-07 ENCOUNTER — Telehealth: Payer: Self-pay | Admitting: *Deleted

## 2018-08-07 ENCOUNTER — Other Ambulatory Visit: Payer: Self-pay

## 2018-08-07 VITALS — BP 100/70 | Ht 68.5 in | Wt 180.0 lb

## 2018-08-07 DIAGNOSIS — Z90710 Acquired absence of both cervix and uterus: Secondary | ICD-10-CM | POA: Diagnosis not present

## 2018-08-07 DIAGNOSIS — N393 Stress incontinence (female) (male): Secondary | ICD-10-CM

## 2018-08-07 DIAGNOSIS — R159 Full incontinence of feces: Secondary | ICD-10-CM

## 2018-08-07 DIAGNOSIS — Z01419 Encounter for gynecological examination (general) (routine) without abnormal findings: Secondary | ICD-10-CM

## 2018-08-07 DIAGNOSIS — Z780 Asymptomatic menopausal state: Secondary | ICD-10-CM | POA: Diagnosis not present

## 2018-08-07 NOTE — Telephone Encounter (Signed)
Referral placed at [facility] location, they will call to schedule.

## 2018-08-07 NOTE — Patient Instructions (Signed)
1. Well female exam with routine gynecological exam Gynecologic exam status post total hysterectomy with BSO.  Grade 1/3 asymptomatic rectocele.  Pap test in January 2019 was negative with negative high-risk HPV, no indication to repeat a Pap test this year.  Breast exam normal.  Last screening mammogram December 2019 at [facility] per patient, will obtain report.  Colonoscopy in 2016.  Health labs with family physician.  2. S/P total hysterectomy  3. Post-menopause Well on no hormone replacement therapy.  Bone density normal in July 2015.  Will consider repeating a bone density next year.  Recommend vitamin D supplements, calcium intake of 1500 mg daily and regular weightbearing physical activities.  4. SUI (stress urinary incontinence, female) Mild to moderate stress urinary incontinence.  Kegel exercises recommended and instructed.  Decision to refer for physical therapy to reinforce the pelvic floor.  Sling procedure will be considered if insufficient improvement with Kegel exercises and physical therapy.  5. Full incontinence of feces Patient will continue to work on nutrition to decrease the occurrence of loose stools and diarrhea.  Prefers to observe at this point rather than being referred to a general/GI surgeon.  Will also work on the anal sphincter muscle reinforcement with physical therapy.  Ell[facility] Yu, it was a pleasure seeing you today!

## 2018-08-07 NOTE — Telephone Encounter (Signed)
-----   Message from Genia Del, MD sent at 08/07/2018 10:17 AM EDT ----- Regarding: Refer to Physical Therapy SUI and Stool incontinence associated with loose stools.  Refer to PT for Pelvic Floor Reinforcement.

## 2018-08-07 NOTE — Progress Notes (Signed)
Ellen Yu [birth year] [id number]   History:    63 y.o. G4P2A2L2 Widowed.  Boyfriend x 1 year  RP:  Established patient presenting for annual gyn exam   HPI: S/P Total Hysterectomy/BSO for fibroids.  Menopause, well on no hormone replacement therapy.  No pelvic pain.  No pain with intercourse.  Wondering about her vaginal anatomy because boyfriend feels like his penis is expulsed from the vagina after ejaculation.  Known small rectocele with no bulging sent home.  No vaginal discharge.  C/O stress urinary incontinence when making an effort making her abdominal muscles contract such as opening a tough door, lifting, coughing or sneezing.  Patient is having frequent loose stools up to diarrhea associated with incontinence of stools.  No blood in stool.  Breast normal.  Body mass index 26.97.  Health labs with family physician.  Colonoscopy in 2016.  Past medical history,surgical history, family history and social history were all reviewed and documented in the EPIC chart.  Gynecologic History No LMP recorded. Patient has had a hysterectomy. Contraception: status post hysterectomy Last Pap: 04/2017. Results were: Negative/HPV HR neg Last mammogram: 03/2018. Results were: normal per patient, will obtain report from [place]. Bone Density: 10/2013 Normal Colonoscopy: 11/2014  Obstetric History OB History  Gravida Para Term Preterm AB Living  4 2 2   2 2   SAB TAB Ectopic Multiple Live Births  2       2    # Outcome Date GA Lbr Len/2nd Weight Sex Delivery Anes PTL Lv  4 SAB           3 SAB           2 Term     F Vag-Spont  N LIV  1 Term     M Vag-Spont  N LIV     ROS: A ROS was performed and pertinent positives and negatives are included in the history.  GENERAL: No fevers or chills. HEENT: No change in vision, no earache, sore throat or sinus congestion. NECK: No pain or stiffness. CARDIOVASCULAR: No chest pain or pressure. No palpitations. PULMONARY: No shortness of breath, cough or  wheeze. GASTROINTESTINAL: No abdominal pain, nausea, vomiting or diarrhea, melena or bright red blood per rectum. GENITOURINARY: No urinary frequency, urgency, hesitancy or dysuria. MUSCULOSKELETAL: No joint or muscle pain, no back pain, no recent trauma. DERMATOLOGIC: No rash, no itching, no lesions. ENDOCRINE: No polyuria, polydipsia, no heat or cold intolerance. No recent change in weight. HEMATOLOGICAL: No anemia or easy bruising or bleeding. NEUROLOGIC: No headache, seizures, numbness, tingling or weakness. PSYCHIATRIC: No depression, no loss of interest in normal activity or change in sleep pattern.     Exam:   BP 100/70   Ht 5' 8.5" (1.74 m)   Wt 180 lb (81.6 kg)   BMI 26.97 kg/m   Body mass index is 26.97 kg/m.  General appearance : Well developed well nourished female. No acute distress HEENT: Eyes: no retinal hemorrhage or exudates,  Neck supple, trachea midline, no carotid bruits, no thyroidmegaly Lungs: Clear to auscultation, no rhonchi or wheezes, or rib retractions  Heart: Regular rate and rhythm, no murmurs or gallops Breast:Examined in sitting and supine position were symmetrical in appearance, no palpable masses or tenderness,  no skin retraction, no nipple inversion, no nipple discharge, no skin discoloration, no axillary or supraclavicular lymphadenopathy Abdomen: no palpable masses or tenderness, no rebound or guarding Extremities: no edema or skin discoloration or tenderness  Pelvic: Vulva: Normal  Vagina: No gross lesions or discharge.  Grade 1/3 rectocele.  Cervix/Uterus absent  Adnexa  Without masses or tenderness  Anus: Normal   Assessment/Plan:  63 y.o. female for annual exam   1. Well female exam with routine gynecological exam Gynecologic exam status post total hysterectomy with BSO.  Grade 1/3 asymptomatic rectocele.  Pap test in January 2019 was negative with negative high-risk HPV, no indication to repeat a Pap test this year.  Breast exam  normal.  Last screening mammogram December 2019 at [facility] per patient, will obtain report.  Colonoscopy in 2016.  Health labs with family physician.  2. S/P total hysterectomy  3. Post-menopause Well on no hormone replacement therapy.  Bone density normal in July 2015.  Will consider repeating a bone density next year.  Recommend vitamin D supplements, calcium intake of 1500 mg daily and regular weightbearing physical activities.  4. SUI (stress urinary incontinence, female) Mild to moderate stress urinary incontinence.  Kegel exercises recommended and instructed.  Decision to refer for physical therapy to reinforce the pelvic floor.  Sling procedure will be considered if insufficient improvement with Kegel exercises and physical therapy.  5. Full incontinence of feces Patient will continue to work on nutrition to decrease the occurrence of loose stools and diarrhea.  Prefers to observe at this point rather than being referred to a general/GI surgeon.  Will also work on the anal sphincter muscle reinforcement with physical therapy.  Counseling on above issues and coordination of care more than 50% for 10 minutes.  Genia DelMarie-Lyne Salih Williamson MD, 9:52 AM 08/07/2018

## 2018-08-14 ENCOUNTER — Other Ambulatory Visit: Payer: Self-pay | Admitting: *Deleted

## 2018-08-14 MED ORDER — NONFORMULARY OR COMPOUNDED ITEM: 3 refills | Status: DC

## 2018-08-14 NOTE — Telephone Encounter (Signed)
Patient had annual exam 08/07/18, no mention in note, okay to send Rx?

## 2018-08-14 NOTE — Telephone Encounter (Signed)
Rx called in 

## 2018-08-25 NOTE — Telephone Encounter (Signed)
Per PT note "Patient does not want to do therapy. I inform the patient her referral will be closed out and she would need to contact the doctor office. If in the future she decide she to changes her mind. "  I will close encounter

## 2019-03-16 ENCOUNTER — Other Ambulatory Visit: Payer: Self-pay

## 2019-03-16 DIAGNOSIS — Z20822 Contact with and (suspected) exposure to covid-19: Secondary | ICD-10-CM

## 2019-03-17 LAB — NOVEL CORONAVIRUS, NAA: SARS-CoV-2, NAA: NOT DETECTED

## 2019-04-06 ENCOUNTER — Other Ambulatory Visit: Payer: Self-pay

## 2019-04-06 NOTE — Telephone Encounter (Signed)
Was using this prior to CE in April but not mentioned at [place]. [place] for refills?

## 2019-04-07 MED ORDER — NONFORMULARY OR COMPOUNDED ITEM: 1 refills | Status: DC

## 2019-04-07 MED ORDER — NONFORMULARY OR COMPOUNDED ITEM: 4 refills | Status: DC

## 2019-04-07 NOTE — Telephone Encounter (Signed)
Rx phoned in.   

## 2019-04-07 NOTE — Telephone Encounter (Signed)
Yes, agree with refill of Compound Estrogen cream.

## 2019-07-07 ENCOUNTER — Encounter: Payer: Self-pay | Admitting: Obstetrics & Gynecology

## 2019-08-10 ENCOUNTER — Encounter: Payer: Self-pay | Admitting: Obstetrics & Gynecology

## 2019-08-10 ENCOUNTER — Other Ambulatory Visit: Payer: Self-pay

## 2019-08-10 ENCOUNTER — Ambulatory Visit (INDEPENDENT_AMBULATORY_CARE_PROVIDER_SITE_OTHER): Payer: BC Managed Care – PPO | Admitting: Obstetrics & Gynecology

## 2019-08-10 VITALS — BP 110/70 | Ht 68.5 in | Wt 179.0 lb

## 2019-08-10 DIAGNOSIS — Z780 Asymptomatic menopausal state: Secondary | ICD-10-CM | POA: Diagnosis not present

## 2019-08-10 DIAGNOSIS — Z01419 Encounter for gynecological examination (general) (routine) without abnormal findings: Secondary | ICD-10-CM

## 2019-08-10 DIAGNOSIS — Z90710 Acquired absence of both cervix and uterus: Secondary | ICD-10-CM | POA: Diagnosis not present

## 2019-08-10 DIAGNOSIS — Z13820 Encounter for screening for osteoporosis: Secondary | ICD-10-CM

## 2019-08-10 MED ORDER — ESTRADIOL 0.1 MG/GM VA CREA: 0.2500 | TOPICAL_CREAM | VAGINAL | 4 refills | Status: DC

## 2019-08-10 NOTE — Progress Notes (Signed)
Ellen Yu 1956/02/23 [id number]   History:    64 y.o. G4P2A2L2 Widowed.  Boyfriend x 2 years  RP:  Established patient presenting for annual gyn exam   HPI: S/P Total Hysterectomy/BSO for fibroids.  Menopause, well on no hormone replacement therapy.  No pelvic pain.  No pain with intercourse.  Using Estradiol cream twice a week. Urine/BMs normal. Breast normal.  Body mass index 26.82.  Health labs with family physician.  Colonoscopy in 2016.   Past medical history,surgical history, family history and social history were all reviewed and documented in the EPIC chart.  Gynecologic History No LMP recorded. Patient has had a hysterectomy.   Obstetric History OB History  Gravida Para Term Preterm AB Living  4 2 2   2 2   SAB TAB Ectopic Multiple Live Births  2       2    # Outcome Date GA Lbr Len/2nd Weight Sex Delivery Anes PTL Lv  4 SAB           3 SAB           2 Term     F Vag-Spont  N LIV  1 Term     M Vag-Spont  N LIV     ROS: A ROS was performed and pertinent positives and negatives are included in the history.  GENERAL: No fevers or chills. HEENT: No change in vision, no earache, sore throat or sinus congestion. NECK: No pain or stiffness. CARDIOVASCULAR: No chest pain or pressure. No palpitations. PULMONARY: No shortness of breath, cough or wheeze. GASTROINTESTINAL: No abdominal pain, nausea, vomiting or diarrhea, melena or bright red blood per rectum. GENITOURINARY: No urinary frequency, urgency, hesitancy or dysuria. MUSCULOSKELETAL: No joint or muscle pain, no back pain, no recent trauma. DERMATOLOGIC: No rash, no itching, no lesions. ENDOCRINE: No polyuria, polydipsia, no heat or cold intolerance. No recent change in weight. HEMATOLOGICAL: No anemia or easy bruising or bleeding. NEUROLOGIC: No headache, seizures, numbness, tingling or weakness. PSYCHIATRIC: No depression, no loss of interest in normal activity or change in sleep pattern.     Exam:   BP 110/70    Ht 5' 8.5" (1.74 m)   Wt 179 lb (81.2 kg)   BMI 26.82 kg/m   Body mass index is 26.82 kg/m.  General appearance : Well developed well nourished female. No acute distress HEENT: Eyes: no retinal hemorrhage or exudates,  Neck supple, trachea midline, no carotid bruits, no thyroidmegaly Lungs: Clear to auscultation, no rhonchi or wheezes, or rib retractions  Heart: Regular rate and rhythm, no murmurs or gallops Breast:Examined in sitting and supine position were symmetrical in appearance, no palpable masses or tenderness,  no skin retraction, no nipple inversion, no nipple discharge, no skin discoloration, no axillary or supraclavicular lymphadenopathy Abdomen: no palpable masses or tenderness, no rebound or guarding Extremities: no edema or skin discoloration or tenderness  Pelvic: Vulva: Normal             Vagina: No gross lesions or discharge  Cervix/Uterus absent  Adnexa  Without masses or tenderness  Anus: Normal                                               Assessment/Plan:  64 y.o. female for annual exam   1. Well female exam with routine gynecological exam Gynecologic exam status post  total hysterectomy and menopause.  No indication to repeat a Pap test this year.  Screening mammogram March 2021 was negative.  Colonoscopy in 2016.  Health labs with family physician.  Good body mass index at 26.82.  Continue with fitness and healthy nutrition.  2. S/P total hysterectomy  3. Post-menopause Well on no hormone replacement therapy.  We will continue on estradiol cream a quarter of an applicator twice a week.  Prescription sent to pharmacy.  4. Screening for osteoporosis Follow-up here for bone density.  Vitamin D supplements, calcium intake of 1200 mg daily and regular weightbearing physical activities. - DG Bone Density; Future  Other orders - predniSONE (STERAPRED UNI-PAK 21 TAB) 5 MG (21) TBPK tablet; Take 5 mg by mouth daily. - estradiol (ESTRACE VAGINAL) 0.1 MG/GM  vaginal cream; Place 7.68 Applicatorfuls vaginally 2 (two) times a week.  Princess Bruins MD, 10:48 AM 08/10/2019

## 2019-08-16 ENCOUNTER — Encounter: Payer: Self-pay | Admitting: Obstetrics & Gynecology

## 2019-08-16 NOTE — Patient Instructions (Signed)
1. Well female exam with routine gynecological exam Gynecologic exam status post total hysterectomy and menopause.  No indication to repeat a Pap test this year.  Screening mammogram March 2021 was negative.  Colonoscopy in 2016.  Health labs with family physician.  Good body mass index at 26.82.  Continue with fitness and healthy nutrition.  2. S/P total hysterectomy  3. Post-menopause Well on no hormone replacement therapy.  We will continue on estradiol cream a quarter of an applicator twice a week.  Prescription sent to pharmacy.  4. Screening for osteoporosis Follow-up here for bone density.  Vitamin D supplements, calcium intake of 1200 mg daily and regular weightbearing physical activities. - DG Bone Density; Future  Other orders - predniSONE (STERAPRED UNI-PAK 21 TAB) 5 MG (21) TBPK tablet; Take 5 mg by mouth daily. - estradiol (ESTRACE VAGINAL) 0.1 MG/GM vaginal cream; Place 0.25 Applicatorfuls vaginally 2 (two) times a week.  Ellen Yu, it was a pleasure seeing you today!

## 2019-08-31 ENCOUNTER — Other Ambulatory Visit: Payer: Self-pay

## 2019-09-01 ENCOUNTER — Ambulatory Visit (INDEPENDENT_AMBULATORY_CARE_PROVIDER_SITE_OTHER): Payer: BC Managed Care – PPO

## 2019-09-01 ENCOUNTER — Other Ambulatory Visit: Payer: Self-pay | Admitting: Obstetrics & Gynecology

## 2019-09-01 DIAGNOSIS — Z780 Asymptomatic menopausal state: Secondary | ICD-10-CM

## 2019-09-01 DIAGNOSIS — Z13820 Encounter for screening for osteoporosis: Secondary | ICD-10-CM

## 2019-11-30 ENCOUNTER — Other Ambulatory Visit: Payer: Self-pay | Admitting: *Deleted

## 2019-11-30 MED ORDER — NONFORMULARY OR COMPOUNDED ITEM: 3 refills | Status: DC

## 2019-11-30 NOTE — Telephone Encounter (Signed)
I called patient to discuss we received a refill request from [facility] regarding compound estradiol 0.02%,. I also noticed in her chart that she had a Rx for estrace cream as well. Patient would prefer to use compound estradiol vaginal cream. Rx called in with refills.

## 2020-10-04 ENCOUNTER — Encounter: Payer: Self-pay | Admitting: Obstetrics & Gynecology

## 2020-10-05 ENCOUNTER — Telehealth: Payer: Self-pay

## 2020-10-05 MED ORDER — NONFORMULARY OR COMPOUNDED ITEM: 0 refills | Status: AC

## 2020-10-05 NOTE — Telephone Encounter (Signed)
Requesting refill on compound estradiol cream.  Last AEX 08/10/2019 Scheduled 11/03/20  Refill sent.

## 2020-11-03 ENCOUNTER — Other Ambulatory Visit (HOSPITAL_COMMUNITY)
Admission: RE | Admit: 2020-11-03 | Discharge: 2020-11-03 | Disposition: A | Discharge status: Home / Self Care | Payer: BC Managed Care – PPO | Source: Ambulatory Visit | Attending: Obstetrics & Gynecology | Admitting: Obstetrics & Gynecology

## 2020-11-03 ENCOUNTER — Encounter: Payer: Self-pay | Admitting: Obstetrics & Gynecology

## 2020-11-03 ENCOUNTER — Ambulatory Visit (INDEPENDENT_AMBULATORY_CARE_PROVIDER_SITE_OTHER): Payer: BC Managed Care – PPO | Admitting: Obstetrics & Gynecology

## 2020-11-03 ENCOUNTER — Other Ambulatory Visit: Payer: Self-pay

## 2020-11-03 VITALS — BP 104/62 | HR 76 | Resp 16 | Ht 67.75 in | Wt 178.0 lb

## 2020-11-03 DIAGNOSIS — Z1272 Encounter for screening for malignant neoplasm of vagina: Secondary | ICD-10-CM | POA: Diagnosis present

## 2020-11-03 DIAGNOSIS — Z90710 Acquired absence of both cervix and uterus: Secondary | ICD-10-CM | POA: Diagnosis not present

## 2020-11-03 DIAGNOSIS — N816 Rectocele: Secondary | ICD-10-CM

## 2020-11-03 DIAGNOSIS — N952 Postmenopausal atrophic vaginitis: Secondary | ICD-10-CM

## 2020-11-03 DIAGNOSIS — Z780 Asymptomatic menopausal state: Secondary | ICD-10-CM | POA: Diagnosis not present

## 2020-11-03 DIAGNOSIS — Z01419 Encounter for gynecological examination (general) (routine) without abnormal findings: Secondary | ICD-10-CM | POA: Insufficient documentation

## 2020-11-03 MED ORDER — ESTRADIOL 0.1 MG/GM VA CREA
0.5000 | TOPICAL_CREAM | VAGINAL | 4 refills | Status: DC
Start: 2020-11-03 — End: 2021-11-01

## 2020-11-03 NOTE — Progress Notes (Signed)
Ellen Yu 04-Apr-1956 [id number]   History:    65 y.o. G4P2A2L2 Widowed.  Boyfriend x 3 years   RP:  Established patient presenting for annual gyn exam   HPI: S/P Total Hysterectomy/BSO for fibroids.  Postmenopause, well on no hormone replacement therapy.  No pelvic pain.  No pain with intercourse.  Using Estradiol cream twice a week. Urine/BMs normal. Breast normal.  Body mass index 27.26.  Health labs with family physician.  Colonoscopy in 2016. BD normal in 2021.   Past medical history,surgical history, family history and social history were all reviewed and documented in the EPIC chart.  Gynecologic History No LMP recorded. Patient has had a hysterectomy.  Obstetric History OB History  Gravida Para Term Preterm AB Living  4 2 2   2 2   SAB IAB Ectopic Multiple Live Births  2       2    # Outcome Date GA Lbr Len/2nd Weight Sex Delivery Anes PTL Lv  4 SAB           3 SAB           2 Term     F Vag-Spont  N LIV  1 Term     M Vag-Spont  N LIV     ROS: A ROS was performed and pertinent positives and negatives are included in the history.  GENERAL: No fevers or chills. HEENT: No change in vision, no earache, sore throat or sinus congestion. NECK: No pain or stiffness. CARDIOVASCULAR: No chest pain or pressure. No palpitations. PULMONARY: No shortness of breath, cough or wheeze. GASTROINTESTINAL: No abdominal pain, nausea, vomiting or diarrhea, melena or bright red blood per rectum. GENITOURINARY: No urinary frequency, urgency, hesitancy or dysuria. MUSCULOSKELETAL: No joint or muscle pain, no back pain, no recent trauma. DERMATOLOGIC: No rash, no itching, no lesions. ENDOCRINE: No polyuria, polydipsia, no heat or cold intolerance. No recent change in weight. HEMATOLOGICAL: No anemia or easy bruising or bleeding. NEUROLOGIC: No headache, seizures, numbness, tingling or weakness. PSYCHIATRIC: No depression, no loss of interest in normal activity or change in sleep pattern.      Exam:   BP 104/62   Pulse 76   Resp 16   Ht 5' 7.75" (1.721 m)   Wt 178 lb (80.7 kg)   BMI 27.26 kg/m   Body mass index is 27.26 kg/m.  General appearance : Well developed well nourished female. No acute distress HEENT: Eyes: no retinal hemorrhage or exudates,  Neck supple, trachea midline, no carotid bruits, no thyroidmegaly Lungs: Clear to auscultation, no rhonchi or wheezes, or rib retractions  Heart: Regular rate and rhythm, no murmurs or gallops Breast:Examined in sitting and supine position were symmetrical in appearance, no palpable masses or tenderness,  no skin retraction, no nipple inversion, no nipple discharge, no skin discoloration, no axillary or supraclavicular lymphadenopathy Abdomen: no palpable masses or tenderness, no rebound or guarding Extremities: no edema or skin discoloration or tenderness  Pelvic: Vulva: Normal             Vagina: No gross lesions or discharge.  Rectocele grade 2/3.  Pap reflex done.  Cervix/Uterus absent  Adnexa  Without masses or tenderness  Anus: Normal   Assessment/Plan:  65 y.o. female for annual exam   1. Encounter for Papanicolaou smear of vagina as part of routine gynecological examination Gynecologic exam status post total hysterectomy and BSO.  Rectocele grade 2/3.  Pap reflex done at the vaginal vault.  Breast exam  normal.  Screening mammogram in 2022 was negative.  Colonoscopy in 2016.  Health labs with family physician.  Body mass index 27.26.  Continue with fitness and healthy nutrition. - Cytology - PAP( [place])  2. S/P total hysterectomy  3. Post-menopause Well on no systemic hormone replacement therapy.  4. Postmenopausal atrophic vaginitis Will continue on estradiol cream twice a week.  5. Rectocele Grade 2/3 asymptomatic rectocele.  Avoidance of pelvic floor pressure recommended.  Counseling done on rectocele.  Other orders - RABEprazole (ACIPHEX) 20 MG tablet; Take by mouth. - pravastatin  (PRAVACHOL) 40 MG tablet; SMARTSIG:1 Tablet(s) By Mouth Every Evening - LORazepam (ATIVAN) 1 MG tablet; Take 1 mg by mouth 3 (three) times daily as needed. - FLUoxetine (PROZAC) 20 MG capsule; Take 20 mg by mouth daily. - dorzolamide-timolol (COSOPT) 22.3-6.8 MG/ML ophthalmic solution; 1 drop 2 (two) times daily. - ASPIRIN LOW DOSE 81 MG EC tablet; Take 81 mg by mouth daily. - albuterol (VENTOLIN HFA) 108 (90 Base) MCG/ACT inhaler; Inhale 2 puffs into the lungs every 6 (six) hours as needed. - estradiol (ESTRACE VAGINAL) 0.1 MG/GM vaginal cream; Place 0.5 Applicatorfuls vaginally 2 (two) times a week.   Genia Del MD, 2:00 PM 11/03/2020

## 2020-11-07 ENCOUNTER — Encounter: Payer: Self-pay | Admitting: Obstetrics & Gynecology

## 2020-11-07 LAB — CYTOLOGY - PAP: Diagnosis: NEGATIVE

## 2020-11-14 ENCOUNTER — Other Ambulatory Visit: Payer: Self-pay | Admitting: *Deleted

## 2020-11-14 MED ORDER — FLUCONAZOLE 150 MG PO TABS: 150.0000 mg | ORAL_TABLET | Freq: Every day | ORAL | 0 refills | Status: AC

## 2020-11-30 ENCOUNTER — Ambulatory Visit: Payer: BC Managed Care – PPO | Admitting: Dermatology

## 2020-11-30 ENCOUNTER — Other Ambulatory Visit: Payer: Self-pay

## 2020-11-30 DIAGNOSIS — L821 Other seborrheic keratosis: Secondary | ICD-10-CM | POA: Diagnosis not present

## 2020-11-30 DIAGNOSIS — Z1283 Encounter for screening for malignant neoplasm of skin: Secondary | ICD-10-CM | POA: Diagnosis not present

## 2020-11-30 DIAGNOSIS — L814 Other melanin hyperpigmentation: Secondary | ICD-10-CM | POA: Diagnosis not present

## 2020-12-11 ENCOUNTER — Encounter: Payer: Self-pay | Admitting: Dermatology

## 2020-12-11 NOTE — Progress Notes (Signed)
   New Patient   Subjective  Ellen Yu is a 65 y.o. female who presents for the following: Annual Exam (Here for annual skin exam. Concern tag mole on neck. ).  General skin examination Location:  Duration:  Quality:  Associated Signs/Symptoms: Modifying Factors:  Severity:  Timing: Context:    The following portions of the chart were reviewed this encounter and updated as appropriate:  Tobacco  Allergies  Meds  Problems  Med Hx  Surg Hx  Fam Hx      Objective  Well appearing patient in no apparent distress; mood and affect are within normal limits. Torso - Posterior (Back) Full body skin examination: No atypical pigmented lesions or nonmelanoma skin cancer.  Dorsum of Nose, Left Malar Cheek For millimeter light brown monochrome symmetric macules; no dermoscopic atypia.  Left Thigh - Anterior Textured tan 5 mm flattopped papule    A full examination was performed including scalp, head, eyes, ears, nose, lips, neck, chest, axillae, abdomen, back, buttocks, bilateral upper extremities, bilateral lower extremities, hands, feet, fingers, toes, fingernails, and toenails. All findings within normal limits unless otherwise noted below.  Areas beneath undergarments not fully examined.   Assessment & Plan  Encounter for screening for malignant neoplasm of skin Torso - Posterior (Back)  Annual skin examination, encouraged to self examine twice annually.  Continue ultraviolet protection.  Solar lentigo (2) Dorsum of Nose; Left Malar Cheek  Recheck as needed change  Seborrheic keratosis Left Thigh - Anterior  Leave if stable

## 2021-10-31 ENCOUNTER — Other Ambulatory Visit: Payer: Self-pay | Admitting: Obstetrics & Gynecology

## 2021-11-01 NOTE — Telephone Encounter (Signed)
Last annual exam was 10/2020 No exam scheduled yet. 

## 2021-11-01 NOTE — Telephone Encounter (Signed)
AEX scheduled 12/04/2021. Last AEX 11/03/2020.  Needs refill Estradiol Vag Cream.

## 2021-12-04 ENCOUNTER — Encounter: Payer: Self-pay | Admitting: Obstetrics & Gynecology

## 2021-12-04 ENCOUNTER — Ambulatory Visit (INDEPENDENT_AMBULATORY_CARE_PROVIDER_SITE_OTHER): Payer: BC Managed Care – PPO | Admitting: Obstetrics & Gynecology

## 2021-12-04 VITALS — BP 122/78 | Ht 67.0 in | Wt 172.0 lb

## 2021-12-04 DIAGNOSIS — N952 Postmenopausal atrophic vaginitis: Secondary | ICD-10-CM

## 2021-12-04 DIAGNOSIS — Z90710 Acquired absence of both cervix and uterus: Secondary | ICD-10-CM

## 2021-12-04 DIAGNOSIS — Z01419 Encounter for gynecological examination (general) (routine) without abnormal findings: Secondary | ICD-10-CM

## 2021-12-04 DIAGNOSIS — Z780 Asymptomatic menopausal state: Secondary | ICD-10-CM

## 2021-12-04 MED ORDER — ESTRADIOL 0.1 MG/GM VA CREA: 0.5000 | TOPICAL_CREAM | VAGINAL | 4 refills | Status: DC

## 2021-12-04 NOTE — Progress Notes (Signed)
ENNIFER HARSTON May 13, 1955 [id number]   History:    66 y.o.  G4P2A2L2 Widowed.  Boyfriend x 5 years   RP:  Established patient presenting for annual gyn exam   HPI: S/P Total Hysterectomy/BSO for fibroids.  Postmenopause, well on no systemic hormone replacement therapy.  No pelvic pain.  No pain with intercourse.  Using Estradiol cream 1/2 an applicator twice a week. Pap Neg 10/2020.  No indication to repeat at this time.  Urine/BMs normal. Breasts normal.  Mammo Neg in 2023 per patient.  Body mass index 26.94.  Health labs with family physician.  Colonoscopy in 2016. BD normal in 08/2019.     Past medical history,surgical history, family history and social history were all reviewed and documented in the EPIC chart.  Gynecologic History No LMP recorded. Patient has had a hysterectomy.  Obstetric History OB History  Gravida Para Term Preterm AB Living  4 2 2   2 2   SAB IAB Ectopic Multiple Live Births  2       2    # Outcome Date GA Lbr Len/2nd Weight Sex Delivery Anes PTL Lv  4 SAB           3 SAB           2 Term     F Vag-Spont  N LIV  1 Term     M Vag-Spont  N LIV     ROS: A ROS was performed and pertinent positives and negatives are included in the history.  GENERAL: No fevers or chills. HEENT: No change in vision, no earache, sore throat or sinus congestion. NECK: No pain or stiffness. CARDIOVASCULAR: No chest pain or pressure. No palpitations. PULMONARY: No shortness of breath, cough or wheeze. GASTROINTESTINAL: No abdominal pain, nausea, vomiting or diarrhea, melena or bright red blood per rectum. GENITOURINARY: No urinary frequency, urgency, hesitancy or dysuria. MUSCULOSKELETAL: No joint or muscle pain, no back pain, no recent trauma. DERMATOLOGIC: No rash, no itching, no lesions. ENDOCRINE: No polyuria, polydipsia, no heat or cold intolerance. No recent change in weight. HEMATOLOGICAL: No anemia or easy bruising or bleeding. NEUROLOGIC: No headache, seizures, numbness,  tingling or weakness. PSYCHIATRIC: No depression, no loss of interest in normal activity or change in sleep pattern.     Exam:   BP 122/78 (BP Location: Right Arm, Patient Position: Sitting, Cuff Size: Normal)   Ht 5\' 7"  (1.702 m)   Wt 172 lb (78 kg)   BMI 26.94 kg/m   Body mass index is 26.94 kg/m.  General appearance : Well developed well nourished female. No acute distress HEENT: Eyes: no retinal hemorrhage or exudates,  Neck supple, trachea midline, no carotid bruits, no thyroidmegaly Lungs: Clear to auscultation, no rhonchi or wheezes, or rib retractions  Heart: Regular rate and rhythm, no murmurs or gallops Breast:Examined in sitting and supine position were symmetrical in appearance, no palpable masses or tenderness,  no skin retraction, no nipple inversion, no nipple discharge, no skin discoloration, no axillary or supraclavicular lymphadenopathy Abdomen: no palpable masses or tenderness, no rebound or guarding Extremities: no edema or skin discoloration or tenderness  Pelvic: Vulva: Normal             Vagina: No gross lesions or discharge  Cervix/Uterus absent  Adnexa  Without masses or tenderness  Anus: Normal   Assessment/Plan:  66 y.o. female for annual exam   1. Well female exam with routine gynecological exam S/P Total Hysterectomy/BSO for fibroids.  Postmenopause, well  on no systemic hormone replacement therapy.  No pelvic pain.  No pain with intercourse.  Using Estradiol cream 1/2 an applicator twice a week. Pap Neg 10/2020.  No indication to repeat at this time.  Urine/BMs normal. Breasts normal.  Mammo Neg in 2023 per patient.  Body mass index 26.94.  Health labs with family physician.  Colonoscopy in 2016. BD normal in 08/2019.     2. S/P total hysterectomy/BSO  3. Post-menopause S/P Total Hysterectomy/BSO for fibroids.  Postmenopause, well on no systemic hormone replacement therapy.  No pelvic pain.  No pain with intercourse.  Using Estradiol cream 1/2 an  applicator twice a week. BD normal in 08/2019.     4. Postmenopausal atrophic vaginitis Continue with Estradiol cream.  No CI.  Prescription sent to pharmacy.  Other orders - SAXENDA 18 MG/3ML SOPN; Inject into the skin. - estradiol (ESTRACE) 0.1 MG/GM vaginal cream; Place 0.5 Applicatorfuls vaginally 2 (two) times a week.   Genia Del MD, 9:31 AM 12/04/2021

## 2022-01-04 ENCOUNTER — Other Ambulatory Visit (HOSPITAL_COMMUNITY): Payer: Self-pay

## 2022-01-04 MED ORDER — TECHLITE PEN NEEDLES 32G X 6 MM MISC
0 refills | Status: AC
  Filled 2022-01-04: qty 100, 90d supply, fill #0

## 2022-01-04 MED ORDER — SAXENDA 18 MG/3ML ~~LOC~~ SOPN
3.0000 mg | PEN_INJECTOR | Freq: Every day | SUBCUTANEOUS | 0 refills | Status: DC
  Filled 2022-01-04: qty 15, 30d supply, fill #0

## 2022-01-23 ENCOUNTER — Other Ambulatory Visit (HOSPITAL_COMMUNITY): Payer: Self-pay

## 2022-01-23 MED ORDER — SAXENDA 18 MG/3ML ~~LOC~~ SOPN
3.0000 mg | PEN_INJECTOR | Freq: Every day | SUBCUTANEOUS | 0 refills | Status: DC
  Filled 2022-01-23 – 2022-01-27 (×3): qty 15, 30d supply, fill #0

## 2022-01-23 MED ORDER — PHENTERMINE HCL 37.5 MG PO TABS
18.7500 mg | ORAL_TABLET | ORAL | 0 refills | Status: DC
  Filled 2022-01-23: qty 30, 60d supply, fill #0

## 2022-01-26 ENCOUNTER — Other Ambulatory Visit (HOSPITAL_COMMUNITY): Payer: Self-pay

## 2022-01-27 ENCOUNTER — Other Ambulatory Visit (HOSPITAL_COMMUNITY): Payer: Self-pay

## 2022-01-31 ENCOUNTER — Other Ambulatory Visit: Payer: Self-pay | Admitting: Obstetrics & Gynecology

## 2022-02-02 NOTE — Telephone Encounter (Signed)
Last annual exam 8/23  Rx was sent with refills in 8/23 for 90 day supply  Rx denied with note attached with the above.

## 2022-02-20 ENCOUNTER — Other Ambulatory Visit (HOSPITAL_COMMUNITY): Payer: Self-pay

## 2022-02-20 MED ORDER — SAXENDA 18 MG/3ML ~~LOC~~ SOPN
3.0000 mg | PEN_INJECTOR | Freq: Every day | SUBCUTANEOUS | 0 refills | Status: DC
  Filled 2022-02-20: qty 15, 30d supply, fill #0

## 2022-03-21 ENCOUNTER — Other Ambulatory Visit (HOSPITAL_COMMUNITY): Payer: Self-pay

## 2022-03-21 MED ORDER — PEN NEEDLES 32G X 4 MM MISC
0 refills | Status: AC
  Filled 2022-03-21: qty 100, 100d supply, fill #0

## 2022-03-21 MED ORDER — SAXENDA 18 MG/3ML ~~LOC~~ SOPN
PEN_INJECTOR | SUBCUTANEOUS | 1 refills | Status: AC
  Filled 2022-03-21 (×3): qty 15, 30d supply, fill #0

## 2022-03-21 MED ORDER — PHENTERMINE HCL 37.5 MG PO TABS
ORAL_TABLET | ORAL | 0 refills | Status: DC
  Filled 2022-03-21: qty 30, 60d supply, fill #0

## 2022-03-23 ENCOUNTER — Other Ambulatory Visit (HOSPITAL_COMMUNITY): Payer: Self-pay

## 2022-03-26 ENCOUNTER — Other Ambulatory Visit (HOSPITAL_COMMUNITY): Payer: Self-pay

## 2022-03-29 ENCOUNTER — Other Ambulatory Visit (HOSPITAL_COMMUNITY): Payer: Self-pay

## 2022-03-29 MED ORDER — SAXENDA 18 MG/3ML ~~LOC~~ SOPN
3.0000 mg | PEN_INJECTOR | Freq: Every day | SUBCUTANEOUS | 1 refills | Status: AC
  Filled 2022-03-29 – 2022-04-04 (×2): qty 15, 30d supply, fill #0
  Filled 2022-05-16 – 2022-05-22 (×2): qty 15, 30d supply, fill #1

## 2022-04-04 ENCOUNTER — Other Ambulatory Visit (HOSPITAL_COMMUNITY): Payer: Self-pay

## 2022-04-24 ENCOUNTER — Other Ambulatory Visit (HOSPITAL_COMMUNITY): Payer: Self-pay

## 2022-04-24 MED ORDER — SAXENDA 18 MG/3ML ~~LOC~~ SOPN
PEN_INJECTOR | SUBCUTANEOUS | 1 refills | Status: AC
  Filled 2022-04-24 – 2022-04-27 (×2): qty 15, 30d supply, fill #0

## 2022-04-27 ENCOUNTER — Other Ambulatory Visit (HOSPITAL_COMMUNITY): Payer: Self-pay

## 2022-05-15 ENCOUNTER — Other Ambulatory Visit (HOSPITAL_COMMUNITY): Payer: Self-pay

## 2022-05-15 MED ORDER — PHENTERMINE HCL 37.5 MG PO TABS
ORAL_TABLET | ORAL | 0 refills | Status: AC
  Filled 2022-05-15: qty 30, 30d supply, fill #0
  Filled 2022-05-16: qty 30, 60d supply, fill #0

## 2022-05-16 ENCOUNTER — Other Ambulatory Visit (HOSPITAL_COMMUNITY): Payer: Self-pay

## 2022-05-22 ENCOUNTER — Other Ambulatory Visit (HOSPITAL_COMMUNITY): Payer: Self-pay

## 2022-05-23 ENCOUNTER — Other Ambulatory Visit (HOSPITAL_COMMUNITY): Payer: Self-pay

## 2022-07-24 ENCOUNTER — Other Ambulatory Visit (HOSPITAL_COMMUNITY): Payer: Self-pay

## 2022-07-24 MED ORDER — ZEPBOUND 5 MG/0.5ML ~~LOC~~ SOAJ
5.0000 mg | SUBCUTANEOUS | 0 refills | Status: AC
  Filled 2022-07-24: qty 2, 28d supply, fill #0

## 2022-07-26 ENCOUNTER — Other Ambulatory Visit (HOSPITAL_COMMUNITY): Payer: Self-pay

## 2022-08-13 ENCOUNTER — Other Ambulatory Visit (HOSPITAL_COMMUNITY): Payer: Self-pay

## 2022-08-13 MED ORDER — ZEPBOUND 7.5 MG/0.5ML ~~LOC~~ SOAJ
SUBCUTANEOUS | 0 refills | Status: AC
  Filled 2022-08-13: qty 2, 28d supply, fill #0

## 2022-08-16 ENCOUNTER — Other Ambulatory Visit: Payer: Self-pay

## 2022-08-16 ENCOUNTER — Other Ambulatory Visit (HOSPITAL_COMMUNITY): Payer: Self-pay

## 2022-08-21 ENCOUNTER — Other Ambulatory Visit: Payer: Self-pay

## 2022-08-21 ENCOUNTER — Other Ambulatory Visit (HOSPITAL_COMMUNITY): Payer: Self-pay

## 2022-08-21 MED ORDER — ZEPBOUND 10 MG/0.5ML ~~LOC~~ SOAJ
10.0000 mg | SUBCUTANEOUS | 0 refills | Status: AC
  Filled 2022-08-21: qty 2, 28d supply, fill #0

## 2022-08-30 ENCOUNTER — Other Ambulatory Visit (HOSPITAL_COMMUNITY): Payer: Self-pay

## 2022-09-07 ENCOUNTER — Other Ambulatory Visit (HOSPITAL_COMMUNITY): Payer: Self-pay

## 2022-09-10 ENCOUNTER — Other Ambulatory Visit: Payer: Self-pay | Admitting: Obstetrics & Gynecology

## 2022-09-10 NOTE — Telephone Encounter (Signed)
Medication refill request: estradiol cream  Last AEX:  12/04/21  Next AEX: none  Scheduled  Last MMG (if hormonal medication request): 10/04/20 Refill authorized: 42.5 g pended for today

## 2022-09-18 ENCOUNTER — Other Ambulatory Visit (HOSPITAL_COMMUNITY): Payer: Self-pay

## 2022-09-18 MED ORDER — ZEPBOUND 12.5 MG/0.5ML ~~LOC~~ SOAJ
12.5000 mg | SUBCUTANEOUS | 1 refills | Status: DC
  Filled 2022-09-18: qty 2, 28d supply, fill #0
  Filled 2022-10-10: qty 2, 28d supply, fill #1

## 2022-09-19 ENCOUNTER — Other Ambulatory Visit (HOSPITAL_COMMUNITY): Payer: Self-pay

## 2022-10-05 ENCOUNTER — Other Ambulatory Visit (HOSPITAL_COMMUNITY): Payer: Self-pay

## 2022-10-10 ENCOUNTER — Other Ambulatory Visit (HOSPITAL_COMMUNITY): Payer: Self-pay

## 2022-10-12 ENCOUNTER — Other Ambulatory Visit: Payer: Self-pay

## 2022-10-12 ENCOUNTER — Other Ambulatory Visit (HOSPITAL_COMMUNITY): Payer: Self-pay

## 2022-11-06 ENCOUNTER — Other Ambulatory Visit (HOSPITAL_COMMUNITY): Payer: Self-pay

## 2022-11-06 MED ORDER — ZEPBOUND 12.5 MG/0.5ML ~~LOC~~ SOAJ
12.5000 mg | SUBCUTANEOUS | 0 refills | Status: DC
  Filled 2022-11-06: qty 2, 28d supply, fill #0
  Filled 2022-11-30: qty 2, 28d supply, fill #1
  Filled 2023-01-01: qty 2, 28d supply, fill #2

## 2022-11-30 ENCOUNTER — Other Ambulatory Visit (HOSPITAL_COMMUNITY): Payer: Self-pay

## 2022-12-04 ENCOUNTER — Other Ambulatory Visit (HOSPITAL_COMMUNITY): Payer: Self-pay

## 2023-01-01 ENCOUNTER — Other Ambulatory Visit (HOSPITAL_COMMUNITY): Payer: Self-pay

## 2023-01-01 ENCOUNTER — Other Ambulatory Visit: Payer: Self-pay

## 2023-01-02 ENCOUNTER — Other Ambulatory Visit (HOSPITAL_COMMUNITY): Payer: Self-pay

## 2023-01-29 ENCOUNTER — Other Ambulatory Visit (HOSPITAL_COMMUNITY): Payer: Self-pay

## 2023-01-29 MED ORDER — ZEPBOUND 12.5 MG/0.5ML ~~LOC~~ SOAJ
12.5000 mg | SUBCUTANEOUS | 0 refills | Status: DC
  Filled 2023-01-29: qty 2, 28d supply, fill #0
  Filled 2023-02-24: qty 2, 28d supply, fill #1
  Filled 2023-04-01: qty 2, 28d supply, fill #2

## 2023-01-30 ENCOUNTER — Other Ambulatory Visit (HOSPITAL_COMMUNITY): Payer: Self-pay

## 2023-02-06 ENCOUNTER — Ambulatory Visit: Payer: BC Managed Care – PPO | Admitting: Dermatology

## 2023-02-26 ENCOUNTER — Other Ambulatory Visit (HOSPITAL_COMMUNITY): Payer: Self-pay

## 2023-02-26 ENCOUNTER — Other Ambulatory Visit: Payer: Self-pay

## 2023-02-27 ENCOUNTER — Other Ambulatory Visit (HOSPITAL_COMMUNITY): Payer: Self-pay

## 2023-04-01 ENCOUNTER — Other Ambulatory Visit: Payer: Self-pay

## 2023-05-14 ENCOUNTER — Other Ambulatory Visit (HOSPITAL_COMMUNITY): Payer: Self-pay

## 2023-05-14 MED ORDER — ZEPBOUND 15 MG/0.5ML ~~LOC~~ SOAJ
SUBCUTANEOUS | 1 refills | Status: AC
  Filled 2023-05-14: qty 2, 28d supply, fill #0
  Filled 2023-06-04: qty 2, 28d supply, fill #1

## 2023-06-05 ENCOUNTER — Other Ambulatory Visit: Payer: Self-pay

## 2023-07-02 ENCOUNTER — Other Ambulatory Visit: Payer: Self-pay

## 2023-07-02 ENCOUNTER — Encounter: Payer: Self-pay | Admitting: Dermatology

## 2023-07-02 ENCOUNTER — Other Ambulatory Visit (HOSPITAL_BASED_OUTPATIENT_CLINIC_OR_DEPARTMENT_OTHER): Payer: Self-pay

## 2023-07-02 ENCOUNTER — Ambulatory Visit: Payer: BC Managed Care – PPO | Admitting: Dermatology

## 2023-07-02 ENCOUNTER — Other Ambulatory Visit (HOSPITAL_COMMUNITY): Payer: Self-pay

## 2023-07-02 VITALS — BP 117/80

## 2023-07-02 DIAGNOSIS — D492 Neoplasm of unspecified behavior of bone, soft tissue, and skin: Secondary | ICD-10-CM | POA: Diagnosis not present

## 2023-07-02 DIAGNOSIS — Z1283 Encounter for screening for malignant neoplasm of skin: Secondary | ICD-10-CM

## 2023-07-02 DIAGNOSIS — W908XXA Exposure to other nonionizing radiation, initial encounter: Secondary | ICD-10-CM

## 2023-07-02 DIAGNOSIS — D485 Neoplasm of uncertain behavior of skin: Secondary | ICD-10-CM

## 2023-07-02 DIAGNOSIS — D225 Melanocytic nevi of trunk: Secondary | ICD-10-CM

## 2023-07-02 DIAGNOSIS — D1801 Hemangioma of skin and subcutaneous tissue: Secondary | ICD-10-CM

## 2023-07-02 DIAGNOSIS — B078 Other viral warts: Secondary | ICD-10-CM | POA: Diagnosis not present

## 2023-07-02 DIAGNOSIS — L814 Other melanin hyperpigmentation: Secondary | ICD-10-CM | POA: Diagnosis not present

## 2023-07-02 DIAGNOSIS — D229 Melanocytic nevi, unspecified: Secondary | ICD-10-CM

## 2023-07-02 DIAGNOSIS — L821 Other seborrheic keratosis: Secondary | ICD-10-CM

## 2023-07-02 DIAGNOSIS — L578 Other skin changes due to chronic exposure to nonionizing radiation: Secondary | ICD-10-CM

## 2023-07-02 MED ORDER — ZEPBOUND 12.5 MG/0.5ML ~~LOC~~ SOAJ
12.5000 mg | SUBCUTANEOUS | 2 refills | Status: AC
  Filled 2023-07-02: qty 2, 28d supply, fill #0
  Filled 2023-07-23: qty 2, 28d supply, fill #1
  Filled 2023-10-08: qty 2, 28d supply, fill #2

## 2023-07-02 MED ORDER — ZEPBOUND 12.5 MG/0.5ML ~~LOC~~ SOAJ
12.5000 mg | SUBCUTANEOUS | 1 refills | Status: AC
  Filled 2023-08-14: qty 2, 28d supply, fill #0
  Filled 2023-09-06: qty 2, 28d supply, fill #1

## 2023-07-02 NOTE — Patient Instructions (Addendum)

## 2023-07-02 NOTE — Progress Notes (Signed)
 New Patient Visit   Subjective  Ellen Yu is a 68 y.o. female who presents for the following:  Total Body Skin Exam (TBSE)  Patient present today for new patient visit for TBSE.The patient reports she has spots, moles and lesions to be evaluated, some may be new or changing and the patient may have concern these could be cancer. Patient has previously been treated by a dermatologist, Dr Jorja Loa. Patient reports she  reports  hx of bx. Patient denies family history of skin cancers. Patient reports throughout her lifetime has had moderate sun exposure. Currently, patient reports if she has excessive sun exposure, she does not apply sunscreen and/or wears protective coverings.  The following portions of the chart were reviewed this encounter and updated as appropriate: medications, allergies, medical history  Review of Systems:  No other skin or systemic complaints except as noted in HPI or Assessment and Plan.  Objective  Well appearing patient in no apparent distress; mood and affect are within normal limits.  A full examination was performed including scalp, head, eyes, ears, nose, lips, neck, chest, axillae, abdomen, back, buttocks, bilateral upper extremities, bilateral lower extremities, hands, feet, fingers, toes, fingernails, and toenails. All findings within normal limits unless otherwise noted below.     Relevant exam findings are noted in the Assessment and Plan.  Left Lower Back 6mm irregular dark brown macule Left Thumb Tip Verrucous papules   Assessment & Plan   LENTIGINES, SEBORRHEIC KERATOSES, HEMANGIOMAS - Benign normal skin lesions - Benign-appearing - Call for any changes  BENIGN MELANOCYTIC NEVI - Tan-brown and/or pink-flesh-colored symmetric macules and papules - Benign appearing on exam today - Observation - Call clinic for new or changing moles - Recommend daily use of broad spectrum spf 30+ sunscreen to sun-exposed areas.   MILD ACTINIC DAMAGE -  Chronic condition, secondary to cumulative UV/sun exposure - diffuse scaly erythematous macules with underlying dyspigmentation - Recommend daily broad spectrum sunscreen SPF 30+ to sun-exposed areas, reapply every 2 hours as needed.  - Staying in the shade or wearing long sleeves, sun glasses (UVA+UVB protection) and wide brim hats (4-inch brim around the entire circumference of the hat) are also recommended for sun protection.  - Call for new or changing lesions.  SKIN CANCER SCREENING PERFORMED TODAY NEOPLASM OF UNCERTAIN BEHAVIOR OF SKIN Left Lower Back Skin / nail biopsy Type of biopsy: tangential   Informed consent: discussed and consent obtained   Timeout: patient name, date of birth, surgical site, and procedure verified   Procedure prep:  Patient was prepped and draped in usual sterile fashion Prep type:  Isopropyl alcohol Anesthesia: the lesion was anesthetized in a standard fashion   Anesthetic:  1% lidocaine w/ epinephrine 1-100,000 buffered w/ 8.4% NaHCO3 Instrument used: DermaBlade   Hemostasis achieved with: aluminum chloride   Outcome: patient tolerated procedure well   Post-procedure details: sterile dressing applied and wound care instructions given   Dressing type: petrolatum gauze and bandage   Specimen 1 - Surgical pathology Differential Diagnosis: r/o DN  Check Margins: yes OTHER VIRAL WARTS Left Thumb Tip Destruction of lesion - Left Thumb Tip Complexity: simple   Destruction method: cryotherapy   Informed consent: discussed and consent obtained   Timeout:  patient name, date of birth, surgical site, and procedure verified Lesion destroyed using liquid nitrogen: Yes   Region frozen until ice ball extended beyond lesion: Yes   Outcome: patient tolerated procedure well with no complications   Post-procedure details: wound care instructions  given    Return in about 1 year (around 07/01/2024) for TBSE.   Documentation: I have reviewed the above documentation  for accuracy and completeness, and I agree with the above.  I, Mahasin Riviere Marcha Solders, CMA, am acting as scribe for [employer], DO.   Langston Reusing, DO

## 2023-07-03 ENCOUNTER — Other Ambulatory Visit (HOSPITAL_COMMUNITY): Payer: Self-pay

## 2023-07-03 LAB — SURGICAL PATHOLOGY

## 2023-07-03 NOTE — Progress Notes (Signed)
  Hi Shirron,  Pt's path was benign however she does not have an active mychart.  Can you call the pt and relay the results.  Thanks!  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

## 2023-07-23 ENCOUNTER — Other Ambulatory Visit (HOSPITAL_COMMUNITY): Payer: Self-pay

## 2023-08-14 ENCOUNTER — Other Ambulatory Visit (HOSPITAL_COMMUNITY): Payer: Self-pay

## 2023-08-27 ENCOUNTER — Other Ambulatory Visit (HOSPITAL_COMMUNITY): Payer: Self-pay

## 2023-08-27 MED ORDER — ZEPBOUND 12.5 MG/0.5ML ~~LOC~~ SOAJ
0.5000 mL | SUBCUTANEOUS | 2 refills | Status: AC
Start: 2023-08-27 — End: ?
  Filled 2023-08-27 – 2023-11-05 (×2): qty 2, 28d supply, fill #0
  Filled 2023-11-28: qty 2, 28d supply, fill #1
  Filled 2023-12-24: qty 2, 28d supply, fill #2

## 2023-09-06 ENCOUNTER — Other Ambulatory Visit (HOSPITAL_COMMUNITY): Payer: Self-pay

## 2023-10-08 ENCOUNTER — Other Ambulatory Visit (HOSPITAL_COMMUNITY): Payer: Self-pay

## 2023-10-30 ENCOUNTER — Ambulatory Visit: Admitting: Dermatology

## 2023-10-30 ENCOUNTER — Encounter: Payer: Self-pay | Admitting: Dermatology

## 2023-10-30 VITALS — BP 108/73

## 2023-10-30 DIAGNOSIS — H01131 Eczematous dermatitis of right upper eyelid: Secondary | ICD-10-CM

## 2023-10-30 DIAGNOSIS — H019 Unspecified inflammation of eyelid: Secondary | ICD-10-CM

## 2023-10-30 MED ORDER — TACROLIMUS 0.03 % EX OINT: TOPICAL_OINTMENT | Freq: Two times a day (BID) | CUTANEOUS | 0 refills | Status: AC

## 2023-10-30 NOTE — Progress Notes (Signed)
   Follow-Up Visit   Subjective  Ellen Yu is a 68 y.o. female established patient who presents for FOLLOW UP on the diagnoses listed below:  Patient was last evaluated on 07/02/23.   Rash: Patient is having skin changes around the eyes that her eye MD was concerned may be eczema. She has been applying Tacrolimus  0.03% to the areas - which has helped.   The following portions of the chart were reviewed this encounter and updated as appropriate: medications, allergies, medical history  Review of Systems:  No other skin or systemic complaints except as noted in HPI or Assessment and Plan.  Objective  Well appearing patient in no apparent distress; mood and affect are within normal limits.   A focused examination was performed of the following areas: skin around eyes   Relevant exam findings are noted in the Assessment and Plan.                   Assessment & Plan    1. Eyelid Dermatitis - Assessment: Bilateral eyelid dermatitis started approximately 2 weeks ago, initially mistaken for allergies. Self-treatment with usenapatidate and benadrylatin provided no relief. Condition exacerbated by rubbing. Another physician diagnosed it as eczema on June 12th. Clinical presentation consistent with contact dermatitis rather than chronic eczema. No history of long-standing eczema. Potential triggers include face wash ingredients, hand-transferred irritants, or new products like Pataday eye drops (started a couple of months ago). Continued use of Pataday does not seem to worsen the condition. No new face washes, shampoos, moisturizers, or sunscreens reported as potential causes.  - Plan:    Continue tacrolimus  0.03% ointment on affected areas until resolution (may take up to 6 weeks)    Apply plain Vaseline daily on affected areas and around eyes for hydration and barrier protection    Advise patient to avoid rubbing eyes    Recommend use of ice packs for symptomatic relief if  needed    Instruct patient to monitor for recurrence and contact the office if symptoms return    Schedule follow-up in March for next routine screening    Consider switching to a stronger treatment for a short period if symptoms persist beyond 6 weeks   Return in about 8 months (around 06/29/2024) for TBSE.   Documentation: I have reviewed the above documentation for accuracy and completeness, and I agree with the above.  I, Shirron Maranda, CMA, am acting as scribe for [employer], DO.   Delon Lenis, DO

## 2023-10-30 NOTE — Patient Instructions (Addendum)
 Date: Wed Oct 30 2023  Hello Yesika Rispoli,  Thank you for visiting today. Here is a summary of the key instructions:  - Medications:   - Apply tacrolimus  0.03% ointment to affected areas of eyelids daily until resolved   - Use Vaseline on affected areas and around eyes daily as a barrier  - Treatment:   - Use ice packs on eyelids for relief if needed   - Avoid rubbing eyes  - Follow-up:   - Return for next routine screening in March 2026   - Call for an appointment if eyelid dermatitis returns   - If condition persists beyond 6 weeks, contact office for possible stronger treatment  - Lifestyle Changes:   - Be cautious about products used around eyes   - If eyelid irritation returns, consider potential triggers from the past 3-4 days  Please reach out if you have any questions or concerns.  Warm regards,  Dr. Delon Lenis, Dermatology   Important Information  Due to recent changes in healthcare laws, you may see results of your pathology and/or laboratory studies on MyChart before the doctors have had a chance to review them. We understand that in some cases there may be results that are confusing or concerning to you. Please understand that not all results are received at the same time and often the doctors may need to interpret multiple results in order to provide you with the best plan of care or course of treatment. Therefore, we ask that you please give us  2 business days to thoroughly review all your results before contacting the office for clarification. Should we see a critical lab result, you will be contacted sooner.   If You Need Anything After Your Visit  If you have any questions or concerns for your doctor, please call our main line at [phone] If no one answers, please leave a voicemail as directed and we will return your call as soon as possible. Messages left after 4 pm will be answered the following business day.   You may also send us  a message via  MyChart. We typically respond to MyChart messages within 1-2 business days.  For prescription refills, please ask your pharmacy to contact our office. Our fax number is [phone].  If you have an urgent issue when the clinic is closed that cannot wait until the next business day, you can page your doctor at the number below.    Please note that while we do our best to be available for urgent issues outside of office hours, we are not available 24/7.   If you have an urgent issue and are unable to reach us , you may choose to seek medical care at your doctor's office, retail clinic, urgent care center, or emergency room.  If you have a medical emergency, please immediately call 911 or go to the emergency department. In the event of inclement weather, please call our main line at [phone] for an update on the status of any delays or closures.  Dermatology Medication Tips: Please keep the boxes that topical medications come in in order to help keep track of the instructions about where and how to use these. Pharmacies typically print the medication instructions only on the boxes and not directly on the medication tubes.   If your medication is too expensive, please contact our office at [phone] or send us  a message through MyChart.   We are unable to tell what your co-pay for medications will be in advance as this  is different depending on your insurance coverage. However, we may be able to find a substitute medication at lower cost or fill out paperwork to get insurance to cover a needed medication.   If a prior authorization is required to get your medication covered by your insurance company, please allow us  1-2 business days to complete this process.  Drug prices often vary depending on where the prescription is filled and some pharmacies may offer cheaper prices.  The website www.goodrx.com contains coupons for medications through different pharmacies. The prices here do not  account for what the cost may be with help from insurance (it may be cheaper with your insurance), but the website can give you the price if you did not use any insurance.  - You can print the associated coupon and take it with your prescription to the pharmacy.  - You may also stop by our office during regular business hours and pick up a GoodRx coupon card.  - If you need your prescription sent electronically to a different pharmacy, notify our office through [facility] or by phone at [phone]

## 2023-11-05 ENCOUNTER — Other Ambulatory Visit (HOSPITAL_COMMUNITY): Payer: Self-pay

## 2023-11-07 ENCOUNTER — Other Ambulatory Visit (HOSPITAL_COMMUNITY): Payer: Self-pay

## 2023-11-12 ENCOUNTER — Other Ambulatory Visit (HOSPITAL_COMMUNITY): Payer: Self-pay

## 2023-11-12 MED ORDER — ZEPBOUND 12.5 MG/0.5ML ~~LOC~~ SOAJ
12.5000 mg | SUBCUTANEOUS | 2 refills | Status: AC
  Filled 2023-11-12 – 2024-01-16 (×2): qty 2, 28d supply, fill #0

## 2023-11-28 ENCOUNTER — Other Ambulatory Visit (HOSPITAL_COMMUNITY): Payer: Self-pay

## 2023-11-28 ENCOUNTER — Other Ambulatory Visit: Payer: Self-pay

## 2023-12-24 ENCOUNTER — Other Ambulatory Visit (HOSPITAL_COMMUNITY): Payer: Self-pay

## 2024-01-16 ENCOUNTER — Other Ambulatory Visit (HOSPITAL_COMMUNITY): Payer: Self-pay

## 2024-02-11 ENCOUNTER — Other Ambulatory Visit (HOSPITAL_COMMUNITY): Payer: Self-pay

## 2024-02-11 MED ORDER — ZEPBOUND 12.5 MG/0.5ML ~~LOC~~ SOAJ
12.5000 mg | SUBCUTANEOUS | 1 refills | Status: AC
  Filled 2024-02-11: qty 2, 28d supply, fill #0
  Filled 2024-03-04: qty 2, 28d supply, fill #1

## 2024-03-04 ENCOUNTER — Other Ambulatory Visit (HOSPITAL_COMMUNITY): Payer: Self-pay

## 2024-04-15 ENCOUNTER — Other Ambulatory Visit (HOSPITAL_COMMUNITY): Payer: Self-pay

## 2024-04-15 ENCOUNTER — Other Ambulatory Visit: Payer: Self-pay

## 2024-04-15 MED ORDER — ZEPBOUND 15 MG/0.5ML ~~LOC~~ SOAJ
15.0000 mg | SUBCUTANEOUS | 0 refills | Status: DC
  Filled 2024-04-15: qty 2, 28d supply, fill #0

## 2024-05-13 ENCOUNTER — Other Ambulatory Visit (HOSPITAL_COMMUNITY): Payer: Self-pay

## 2024-05-13 MED ORDER — ZEPBOUND 15 MG/0.5ML ~~LOC~~ SOAJ
15.0000 mg | SUBCUTANEOUS | 0 refills | Status: AC
  Filled 2024-05-13: qty 2, 28d supply, fill #0

## 2024-07-01 ENCOUNTER — Ambulatory Visit: Admitting: Dermatology
